# Patient Record
Sex: Female | Born: 1945 | Race: White | Hispanic: No | Marital: Married | Smoking: Never smoker
Health system: Southern US, Community
[De-identification: ages and names within clinical notes are randomized; demographics above are authoritative.]

## PROBLEM LIST (undated history)

## (undated) DIAGNOSIS — I729 Aneurysm of unspecified site: Secondary | ICD-10-CM

## (undated) DIAGNOSIS — K589 Irritable bowel syndrome without diarrhea: Secondary | ICD-10-CM

## (undated) DIAGNOSIS — I5189 Other ill-defined heart diseases: Secondary | ICD-10-CM

## (undated) DIAGNOSIS — F32A Depression, unspecified: Secondary | ICD-10-CM

## (undated) DIAGNOSIS — K279 Peptic ulcer, site unspecified, unspecified as acute or chronic, without hemorrhage or perforation: Secondary | ICD-10-CM

## (undated) DIAGNOSIS — M25561 Pain in right knee: Secondary | ICD-10-CM

## (undated) DIAGNOSIS — I10 Essential (primary) hypertension: Secondary | ICD-10-CM

## (undated) DIAGNOSIS — S83209A Unspecified tear of unspecified meniscus, current injury, unspecified knee, initial encounter: Secondary | ICD-10-CM

## (undated) DIAGNOSIS — M1990 Unspecified osteoarthritis, unspecified site: Secondary | ICD-10-CM

## (undated) DIAGNOSIS — G4730 Sleep apnea, unspecified: Secondary | ICD-10-CM

## (undated) DIAGNOSIS — J209 Acute bronchitis, unspecified: Secondary | ICD-10-CM

## (undated) DIAGNOSIS — I2510 Atherosclerotic heart disease of native coronary artery without angina pectoris: Secondary | ICD-10-CM

## (undated) DIAGNOSIS — F329 Major depressive disorder, single episode, unspecified: Secondary | ICD-10-CM

## (undated) DIAGNOSIS — N951 Menopausal and female climacteric states: Secondary | ICD-10-CM

## (undated) DIAGNOSIS — R32 Unspecified urinary incontinence: Secondary | ICD-10-CM

## (undated) DIAGNOSIS — F419 Anxiety disorder, unspecified: Secondary | ICD-10-CM

## (undated) DIAGNOSIS — M545 Low back pain, unspecified: Secondary | ICD-10-CM

## (undated) DIAGNOSIS — K219 Gastro-esophageal reflux disease without esophagitis: Secondary | ICD-10-CM

## (undated) DIAGNOSIS — E785 Hyperlipidemia, unspecified: Secondary | ICD-10-CM

## (undated) DIAGNOSIS — N3281 Overactive bladder: Secondary | ICD-10-CM

## (undated) DIAGNOSIS — R55 Syncope and collapse: Secondary | ICD-10-CM

## (undated) HISTORY — DX: Peptic ulcer, site unspecified, unspecified as acute or chronic, without hemorrhage or perforation: K27.9

## (undated) HISTORY — DX: Other ill-defined heart diseases: I51.89

## (undated) HISTORY — DX: Menopausal and female climacteric states: N95.1

## (undated) HISTORY — DX: Unspecified tear of unspecified meniscus, current injury, unspecified knee, initial encounter: S83.209A

## (undated) HISTORY — PX: ROTATOR CUFF REPAIR: SHX139

## (undated) HISTORY — DX: Unspecified osteoarthritis, unspecified site: M19.90

## (undated) HISTORY — DX: Aneurysm of unspecified site: I72.9

## (undated) HISTORY — PX: BREAST BIOPSY: SHX20

## (undated) HISTORY — DX: Atherosclerotic heart disease of native coronary artery without angina pectoris: I25.10

## (undated) HISTORY — DX: Acute bronchitis, unspecified: J20.9

## (undated) HISTORY — DX: Depression, unspecified: F32.A

## (undated) HISTORY — DX: Syncope and collapse: R55

## (undated) HISTORY — DX: Pain in right knee: M25.561

## (undated) HISTORY — PX: CATARACT EXTRACTION, BILATERAL: SHX1313

## (undated) HISTORY — DX: Anxiety disorder, unspecified: F41.9

## (undated) HISTORY — DX: Low back pain: M54.5

## (undated) HISTORY — PX: MANDIBLE SURGERY: SHX707

## (undated) HISTORY — DX: Essential (primary) hypertension: I10

## (undated) HISTORY — DX: Unspecified urinary incontinence: R32

## (undated) HISTORY — DX: Irritable bowel syndrome, unspecified: K58.9

## (undated) HISTORY — DX: Major depressive disorder, single episode, unspecified: F32.9

## (undated) HISTORY — PX: TOTAL ABDOMINAL HYSTERECTOMY: SHX209

## (undated) HISTORY — DX: Overactive bladder: N32.81

## (undated) HISTORY — DX: Gastro-esophageal reflux disease without esophagitis: K21.9

## (undated) HISTORY — DX: Low back pain, unspecified: M54.50

## (undated) HISTORY — DX: Hyperlipidemia, unspecified: E78.5

---

## 1999-03-24 HISTORY — PX: ESOPHAGOGASTRODUODENOSCOPY: SHX1529

## 1999-03-24 HISTORY — PX: ABDOMINAL AORTIC ANEURYSM REPAIR: SUR1152

## 1999-03-24 HISTORY — PX: COLONOSCOPY: SHX174

## 2003-05-22 HISTORY — PX: COLONOSCOPY: SHX174

## 2003-06-01 ENCOUNTER — Encounter (INDEPENDENT_AMBULATORY_CARE_PROVIDER_SITE_OTHER): Payer: Self-pay | Admitting: Family Medicine

## 2004-04-10 ENCOUNTER — Ambulatory Visit: Payer: Self-pay | Admitting: Unknown Physician Specialty

## 2004-04-10 ENCOUNTER — Encounter (INDEPENDENT_AMBULATORY_CARE_PROVIDER_SITE_OTHER): Payer: Self-pay | Admitting: Family Medicine

## 2004-09-20 ENCOUNTER — Encounter (INDEPENDENT_AMBULATORY_CARE_PROVIDER_SITE_OTHER): Payer: Self-pay | Admitting: Family Medicine

## 2004-09-20 LAB — CONVERTED CEMR LAB: Pap Smear: NORMAL

## 2005-07-24 ENCOUNTER — Ambulatory Visit: Payer: Self-pay | Admitting: Family Medicine

## 2005-07-27 ENCOUNTER — Ambulatory Visit: Payer: Self-pay | Admitting: Family Medicine

## 2005-07-27 ENCOUNTER — Ambulatory Visit (HOSPITAL_COMMUNITY): Admission: RE | Admit: 2005-07-27 | Discharge: 2005-07-27 | Payer: Self-pay | Admitting: Family Medicine

## 2005-07-28 ENCOUNTER — Ambulatory Visit (HOSPITAL_COMMUNITY): Admission: RE | Admit: 2005-07-28 | Discharge: 2005-07-28 | Payer: Self-pay | Admitting: Family Medicine

## 2005-07-28 ENCOUNTER — Encounter (INDEPENDENT_AMBULATORY_CARE_PROVIDER_SITE_OTHER): Payer: Self-pay | Admitting: Family Medicine

## 2005-08-07 ENCOUNTER — Ambulatory Visit: Payer: Self-pay | Admitting: Family Medicine

## 2005-08-21 ENCOUNTER — Ambulatory Visit: Payer: Self-pay | Admitting: Family Medicine

## 2005-08-26 ENCOUNTER — Encounter (INDEPENDENT_AMBULATORY_CARE_PROVIDER_SITE_OTHER): Payer: Self-pay | Admitting: Family Medicine

## 2005-08-26 ENCOUNTER — Ambulatory Visit (HOSPITAL_COMMUNITY): Admission: RE | Admit: 2005-08-26 | Discharge: 2005-08-26 | Payer: Self-pay | Admitting: General Surgery

## 2005-09-01 ENCOUNTER — Encounter (INDEPENDENT_AMBULATORY_CARE_PROVIDER_SITE_OTHER): Payer: Self-pay | Admitting: Family Medicine

## 2005-09-01 ENCOUNTER — Ambulatory Visit: Payer: Self-pay | Admitting: General Practice

## 2005-09-03 ENCOUNTER — Ambulatory Visit: Payer: Self-pay | Admitting: Family Medicine

## 2005-09-28 ENCOUNTER — Ambulatory Visit: Payer: Self-pay | Admitting: Ophthalmology

## 2006-01-29 ENCOUNTER — Ambulatory Visit: Payer: Self-pay | Admitting: Family Medicine

## 2006-01-29 LAB — CONVERTED CEMR LAB
RBC count: 4.78 10*6/uL
TSH: 1.225 microintl units/mL

## 2006-02-26 ENCOUNTER — Ambulatory Visit: Payer: Self-pay | Admitting: Family Medicine

## 2006-03-02 ENCOUNTER — Encounter: Payer: Self-pay | Admitting: Family Medicine

## 2006-03-02 DIAGNOSIS — K219 Gastro-esophageal reflux disease without esophagitis: Secondary | ICD-10-CM

## 2006-03-02 DIAGNOSIS — K279 Peptic ulcer, site unspecified, unspecified as acute or chronic, without hemorrhage or perforation: Secondary | ICD-10-CM

## 2006-03-02 DIAGNOSIS — J309 Allergic rhinitis, unspecified: Secondary | ICD-10-CM | POA: Insufficient documentation

## 2006-03-02 DIAGNOSIS — E785 Hyperlipidemia, unspecified: Secondary | ICD-10-CM

## 2006-03-02 DIAGNOSIS — M545 Low back pain: Secondary | ICD-10-CM

## 2006-03-02 DIAGNOSIS — N318 Other neuromuscular dysfunction of bladder: Secondary | ICD-10-CM

## 2006-03-02 DIAGNOSIS — F329 Major depressive disorder, single episode, unspecified: Secondary | ICD-10-CM

## 2006-03-02 DIAGNOSIS — F411 Generalized anxiety disorder: Secondary | ICD-10-CM | POA: Insufficient documentation

## 2006-03-02 DIAGNOSIS — K589 Irritable bowel syndrome without diarrhea: Secondary | ICD-10-CM

## 2006-03-02 DIAGNOSIS — M1990 Unspecified osteoarthritis, unspecified site: Secondary | ICD-10-CM | POA: Insufficient documentation

## 2006-03-29 ENCOUNTER — Ambulatory Visit: Payer: Self-pay | Admitting: Ophthalmology

## 2006-03-30 ENCOUNTER — Encounter (INDEPENDENT_AMBULATORY_CARE_PROVIDER_SITE_OTHER): Payer: Self-pay | Admitting: Family Medicine

## 2006-04-09 ENCOUNTER — Ambulatory Visit: Payer: Self-pay | Admitting: Family Medicine

## 2006-04-09 LAB — CONVERTED CEMR LAB
Alkaline Phosphatase: 80 units/L (ref 39–117)
Indirect Bilirubin: 0.4 mg/dL (ref 0.0–0.9)
Total Protein: 7.5 g/dL (ref 6.0–8.3)

## 2006-05-21 ENCOUNTER — Ambulatory Visit: Payer: Self-pay | Admitting: Family Medicine

## 2006-05-21 DIAGNOSIS — N951 Menopausal and female climacteric states: Secondary | ICD-10-CM

## 2006-05-21 LAB — CONVERTED CEMR LAB
HDL goal, serum: 40 mg/dL
LDL Goal: 160 mg/dL

## 2006-05-24 LAB — CONVERTED CEMR LAB
ALT: 17 units/L (ref 0–35)
AST: 15 units/L (ref 0–37)
Albumin: 4.2 g/dL (ref 3.5–5.2)
BUN: 14 mg/dL (ref 6–23)
Basophils Absolute: 0 10*3/uL (ref 0.0–0.1)
Basophils Relative: 1 % (ref 0–1)
CO2: 24 meq/L (ref 19–32)
Cholesterol: 192 mg/dL (ref 0–200)
Creatinine, Ser: 0.66 mg/dL (ref 0.40–1.20)
Glucose, Bld: 88 mg/dL (ref 70–99)
MCHC: 32.2 g/dL (ref 30.0–36.0)
Monocytes Absolute: 0.3 10*3/uL (ref 0.2–0.7)
Potassium: 4.6 meq/L (ref 3.5–5.3)
RBC: 4.68 M/uL (ref 3.87–5.11)
RDW: 13.5 % (ref 11.5–14.0)
Total CHOL/HDL Ratio: 3.8
Triglycerides: 123 mg/dL (ref <150)
VLDL: 25 mg/dL (ref 0–40)
WBC: 4.6 10*3/uL (ref 4.0–10.5)

## 2006-09-17 ENCOUNTER — Ambulatory Visit: Payer: Self-pay | Admitting: Family Medicine

## 2006-09-17 LAB — CONVERTED CEMR LAB: Hemoglobin: 12.5 g/dL

## 2006-09-20 ENCOUNTER — Ambulatory Visit: Payer: Self-pay | Admitting: Endocrinology

## 2006-09-28 ENCOUNTER — Ambulatory Visit: Payer: Self-pay | Admitting: Endocrinology

## 2006-09-28 ENCOUNTER — Encounter (INDEPENDENT_AMBULATORY_CARE_PROVIDER_SITE_OTHER): Payer: Self-pay | Admitting: Family Medicine

## 2006-09-30 ENCOUNTER — Telehealth (INDEPENDENT_AMBULATORY_CARE_PROVIDER_SITE_OTHER): Payer: Self-pay | Admitting: *Deleted

## 2006-10-01 ENCOUNTER — Ambulatory Visit: Payer: Self-pay | Admitting: Family Medicine

## 2006-10-13 ENCOUNTER — Encounter (INDEPENDENT_AMBULATORY_CARE_PROVIDER_SITE_OTHER): Payer: Self-pay | Admitting: Family Medicine

## 2006-10-15 ENCOUNTER — Encounter: Admission: RE | Admit: 2006-10-15 | Discharge: 2006-10-15 | Payer: Self-pay | Admitting: *Deleted

## 2006-10-15 ENCOUNTER — Encounter (INDEPENDENT_AMBULATORY_CARE_PROVIDER_SITE_OTHER): Payer: Self-pay | Admitting: Diagnostic Radiology

## 2006-10-19 ENCOUNTER — Telehealth (INDEPENDENT_AMBULATORY_CARE_PROVIDER_SITE_OTHER): Payer: Self-pay | Admitting: Family Medicine

## 2006-12-17 ENCOUNTER — Ambulatory Visit: Payer: Self-pay | Admitting: Family Medicine

## 2006-12-17 DIAGNOSIS — M25569 Pain in unspecified knee: Secondary | ICD-10-CM

## 2006-12-17 LAB — CONVERTED CEMR LAB: LDL Goal: 100 mg/dL

## 2006-12-21 ENCOUNTER — Ambulatory Visit (HOSPITAL_COMMUNITY): Admission: RE | Admit: 2006-12-21 | Discharge: 2006-12-21 | Payer: Self-pay | Admitting: Family Medicine

## 2006-12-24 DIAGNOSIS — IMO0002 Reserved for concepts with insufficient information to code with codable children: Secondary | ICD-10-CM

## 2006-12-30 ENCOUNTER — Ambulatory Visit: Payer: Self-pay | Admitting: Family Medicine

## 2006-12-31 ENCOUNTER — Encounter (INDEPENDENT_AMBULATORY_CARE_PROVIDER_SITE_OTHER): Payer: Self-pay | Admitting: Family Medicine

## 2007-01-03 LAB — CONVERTED CEMR LAB
ALT: 17 units/L (ref 0–35)
Albumin: 4.1 g/dL (ref 3.5–5.2)
Alkaline Phosphatase: 61 units/L (ref 39–117)
BUN: 15 mg/dL (ref 6–23)
Basophils Absolute: 0 10*3/uL (ref 0.0–0.1)
Basophils Relative: 0 % (ref 0–1)
Chloride: 108 meq/L (ref 96–112)
Creatinine, Ser: 0.59 mg/dL (ref 0.40–1.20)
MCHC: 32.6 g/dL (ref 30.0–36.0)
Monocytes Absolute: 0.3 10*3/uL (ref 0.2–0.7)
Monocytes Relative: 5 % (ref 3–11)
Neutrophils Relative %: 60 % (ref 43–77)
Potassium: 4.4 meq/L (ref 3.5–5.3)
TSH: 1.133 microintl units/mL (ref 0.350–5.50)
Total Bilirubin: 0.3 mg/dL (ref 0.3–1.2)
WBC: 5.8 10*3/uL (ref 4.0–10.5)

## 2007-03-01 ENCOUNTER — Telehealth (INDEPENDENT_AMBULATORY_CARE_PROVIDER_SITE_OTHER): Payer: Self-pay | Admitting: *Deleted

## 2007-03-04 ENCOUNTER — Encounter (INDEPENDENT_AMBULATORY_CARE_PROVIDER_SITE_OTHER): Payer: Self-pay | Admitting: Family Medicine

## 2007-03-10 ENCOUNTER — Ambulatory Visit: Payer: Self-pay | Admitting: Family Medicine

## 2007-03-24 HISTORY — PX: KNEE ARTHROSCOPY: SUR90

## 2007-04-07 ENCOUNTER — Ambulatory Visit (HOSPITAL_COMMUNITY): Admission: RE | Admit: 2007-04-07 | Discharge: 2007-04-07 | Payer: Self-pay | Admitting: Orthopaedic Surgery

## 2007-04-22 ENCOUNTER — Ambulatory Visit: Payer: Self-pay | Admitting: Family Medicine

## 2007-04-22 DIAGNOSIS — R32 Unspecified urinary incontinence: Secondary | ICD-10-CM | POA: Insufficient documentation

## 2007-12-22 ENCOUNTER — Ambulatory Visit: Payer: Self-pay | Admitting: Endocrinology

## 2008-01-20 ENCOUNTER — Ambulatory Visit: Payer: Self-pay | Admitting: Family Medicine

## 2008-01-21 ENCOUNTER — Encounter (INDEPENDENT_AMBULATORY_CARE_PROVIDER_SITE_OTHER): Payer: Self-pay | Admitting: Family Medicine

## 2008-01-23 LAB — CONVERTED CEMR LAB
Albumin: 4.2 g/dL (ref 3.5–5.2)
BUN: 17 mg/dL (ref 6–23)
Basophils Absolute: 0 10*3/uL (ref 0.0–0.1)
Basophils Relative: 0 % (ref 0–1)
CO2: 19 meq/L (ref 19–32)
Calcium: 9 mg/dL (ref 8.4–10.5)
Creatinine, Ser: 0.69 mg/dL (ref 0.40–1.20)
Eosinophils Absolute: 0.1 10*3/uL (ref 0.0–0.7)
Glucose, Bld: 98 mg/dL (ref 70–99)
Hemoglobin: 13.6 g/dL (ref 12.0–15.0)
LDL Cholesterol: 160 mg/dL — ABNORMAL HIGH (ref 0–99)
Monocytes Absolute: 0.3 10*3/uL (ref 0.1–1.0)
Monocytes Relative: 7 % (ref 3–12)
Platelets: 253 10*3/uL (ref 150–400)
RBC: 4.65 M/uL (ref 3.87–5.11)
TSH: 1.215 microintl units/mL (ref 0.350–4.50)
Triglycerides: 102 mg/dL (ref <150)
VLDL: 20 mg/dL (ref 0–40)

## 2008-01-25 ENCOUNTER — Telehealth (INDEPENDENT_AMBULATORY_CARE_PROVIDER_SITE_OTHER): Payer: Self-pay | Admitting: *Deleted

## 2008-01-25 ENCOUNTER — Encounter (INDEPENDENT_AMBULATORY_CARE_PROVIDER_SITE_OTHER): Payer: Self-pay | Admitting: Family Medicine

## 2008-07-25 ENCOUNTER — Ambulatory Visit: Payer: Self-pay | Admitting: Family Medicine

## 2008-07-25 ENCOUNTER — Ambulatory Visit (HOSPITAL_COMMUNITY): Admission: RE | Admit: 2008-07-25 | Discharge: 2008-07-25 | Payer: Self-pay | Admitting: Family Medicine

## 2008-07-25 DIAGNOSIS — R51 Headache: Secondary | ICD-10-CM

## 2008-07-25 DIAGNOSIS — R519 Headache, unspecified: Secondary | ICD-10-CM | POA: Insufficient documentation

## 2008-07-25 DIAGNOSIS — I2510 Atherosclerotic heart disease of native coronary artery without angina pectoris: Secondary | ICD-10-CM | POA: Insufficient documentation

## 2008-07-25 LAB — CONVERTED CEMR LAB
ALT: 20 units/L (ref 0–35)
AST: 17 units/L (ref 0–37)
BUN: 12 mg/dL (ref 6–23)
Basophils Absolute: 0 10*3/uL (ref 0.0–0.1)
CO2: 23 meq/L (ref 19–32)
Glucose, Bld: 125 mg/dL — ABNORMAL HIGH (ref 70–99)
HCT: 39.8 % (ref 36.0–46.0)
MCHC: 34.4 g/dL (ref 30.0–36.0)
MCV: 90.8 fL (ref 78.0–100.0)
Monocytes Relative: 3 % (ref 3–12)
Neutro Abs: 4.3 10*3/uL (ref 1.7–7.7)
Neutrophils Relative %: 77 % (ref 43–77)
Platelets: 222 10*3/uL (ref 150–400)
RBC: 4.39 M/uL (ref 3.87–5.11)
RDW: 13.3 % (ref 11.5–15.5)
Total Bilirubin: 0.6 mg/dL (ref 0.3–1.2)
Total Protein: 6.8 g/dL (ref 6.0–8.3)
WBC: 5.5 10*3/uL (ref 4.0–10.5)

## 2008-08-09 ENCOUNTER — Ambulatory Visit: Payer: Self-pay | Admitting: Family Medicine

## 2008-08-11 ENCOUNTER — Encounter (INDEPENDENT_AMBULATORY_CARE_PROVIDER_SITE_OTHER): Payer: Self-pay | Admitting: Family Medicine

## 2008-08-13 ENCOUNTER — Encounter (INDEPENDENT_AMBULATORY_CARE_PROVIDER_SITE_OTHER): Payer: Self-pay | Admitting: *Deleted

## 2008-08-13 LAB — CONVERTED CEMR LAB
LDL Cholesterol: 156 mg/dL — ABNORMAL HIGH (ref 0–99)
Triglycerides: 128 mg/dL (ref <150)
VLDL: 26 mg/dL (ref 0–40)

## 2008-08-16 ENCOUNTER — Encounter: Payer: Self-pay | Admitting: Cardiology

## 2008-08-16 ENCOUNTER — Ambulatory Visit: Payer: Self-pay | Admitting: Cardiology

## 2008-08-16 ENCOUNTER — Encounter (INDEPENDENT_AMBULATORY_CARE_PROVIDER_SITE_OTHER): Payer: Self-pay | Admitting: Family Medicine

## 2008-08-23 ENCOUNTER — Encounter: Payer: Self-pay | Admitting: Cardiology

## 2008-08-23 ENCOUNTER — Ambulatory Visit (HOSPITAL_COMMUNITY): Admission: RE | Admit: 2008-08-23 | Discharge: 2008-08-23 | Payer: Self-pay | Admitting: Cardiology

## 2008-08-23 ENCOUNTER — Ambulatory Visit: Payer: Self-pay | Admitting: Cardiology

## 2008-09-05 ENCOUNTER — Encounter (INDEPENDENT_AMBULATORY_CARE_PROVIDER_SITE_OTHER): Payer: Self-pay | Admitting: *Deleted

## 2008-09-05 ENCOUNTER — Ambulatory Visit: Payer: Self-pay | Admitting: Cardiology

## 2008-09-05 ENCOUNTER — Ambulatory Visit (HOSPITAL_COMMUNITY): Admission: RE | Admit: 2008-09-05 | Discharge: 2008-09-05 | Payer: Self-pay | Admitting: Cardiology

## 2008-09-05 LAB — CONVERTED CEMR LAB
CO2: 26 meq/L
Calcium: 9.5 mg/dL
Prothrombin Time: 13.4 s
Sodium: 139 meq/L

## 2008-09-10 ENCOUNTER — Inpatient Hospital Stay (HOSPITAL_BASED_OUTPATIENT_CLINIC_OR_DEPARTMENT_OTHER): Admission: RE | Admit: 2008-09-10 | Discharge: 2008-09-10 | Payer: Self-pay | Admitting: Internal Medicine

## 2008-09-10 ENCOUNTER — Ambulatory Visit: Payer: Self-pay | Admitting: Cardiovascular Disease

## 2008-10-01 ENCOUNTER — Ambulatory Visit: Payer: Self-pay | Admitting: Cardiology

## 2008-10-04 ENCOUNTER — Ambulatory Visit (HOSPITAL_COMMUNITY): Admission: RE | Admit: 2008-10-04 | Discharge: 2008-10-04 | Payer: Self-pay | Admitting: Cardiology

## 2008-10-05 ENCOUNTER — Ambulatory Visit: Payer: Self-pay | Admitting: Cardiology

## 2008-11-02 ENCOUNTER — Encounter: Payer: Self-pay | Admitting: Cardiology

## 2008-11-03 ENCOUNTER — Encounter: Payer: Self-pay | Admitting: Cardiology

## 2008-11-03 ENCOUNTER — Encounter (INDEPENDENT_AMBULATORY_CARE_PROVIDER_SITE_OTHER): Payer: Self-pay | Admitting: *Deleted

## 2008-11-03 LAB — CONVERTED CEMR LAB
AST: 17 units/L
AST: 17 units/L (ref 0–37)
Albumin: 4.1 g/dL
Alkaline Phosphatase: 65 units/L
BUN: 15 mg/dL
BUN: 15 mg/dL (ref 6–23)
CO2: 23 meq/L
Cholesterol: 139 mg/dL (ref 0–200)
Creatinine, Ser: 0.59 mg/dL
Creatinine, Ser: 0.59 mg/dL (ref 0.40–1.20)
Glucose, Bld: 90 mg/dL
HDL: 52 mg/dL (ref >39)
LDL Cholesterol: 72 mg/dL (ref 0–99)
Sodium: 139 meq/L (ref 135–145)
Total Bilirubin: 0.4 mg/dL (ref 0.3–1.2)
Total Protein: 6.9 g/dL
Total Protein: 6.9 g/dL (ref 6.0–8.3)
Triglycerides: 74 mg/dL (ref <150)

## 2008-11-05 ENCOUNTER — Encounter: Payer: Self-pay | Admitting: Cardiology

## 2008-11-05 ENCOUNTER — Telehealth: Payer: Self-pay | Admitting: Cardiology

## 2008-11-23 ENCOUNTER — Ambulatory Visit: Payer: Self-pay | Admitting: Cardiology

## 2008-11-30 ENCOUNTER — Ambulatory Visit: Payer: Self-pay | Admitting: Family Medicine

## 2008-11-30 LAB — CONVERTED CEMR LAB
Cholesterol, target level: 200 mg/dL
HDL goal, serum: 40 mg/dL
LDL Goal: 130 mg/dL

## 2008-12-10 ENCOUNTER — Telehealth (INDEPENDENT_AMBULATORY_CARE_PROVIDER_SITE_OTHER): Payer: Self-pay | Admitting: *Deleted

## 2009-01-10 ENCOUNTER — Ambulatory Visit: Payer: Self-pay

## 2009-01-16 ENCOUNTER — Ambulatory Visit: Payer: Self-pay

## 2009-02-27 ENCOUNTER — Encounter (INDEPENDENT_AMBULATORY_CARE_PROVIDER_SITE_OTHER): Payer: Self-pay | Admitting: *Deleted

## 2009-02-28 ENCOUNTER — Ambulatory Visit: Payer: Self-pay | Admitting: Cardiology

## 2009-02-28 ENCOUNTER — Encounter (INDEPENDENT_AMBULATORY_CARE_PROVIDER_SITE_OTHER): Payer: Self-pay | Admitting: *Deleted

## 2009-03-25 ENCOUNTER — Telehealth: Payer: Self-pay | Admitting: Cardiology

## 2009-04-11 ENCOUNTER — Encounter (INDEPENDENT_AMBULATORY_CARE_PROVIDER_SITE_OTHER): Payer: Self-pay | Admitting: *Deleted

## 2009-04-11 LAB — CONVERTED CEMR LAB
BUN: 14 mg/dL (ref 6–23)
Chloride: 104 meq/L (ref 96–112)
Creatinine, Ser: 0.69 mg/dL (ref 0.40–1.20)
Glucose, Bld: 99 mg/dL
Glucose, Bld: 99 mg/dL (ref 70–99)
Potassium: 3.7 meq/L
Potassium: 3.7 meq/L (ref 3.5–5.3)
Sodium: 141 meq/L (ref 135–145)

## 2009-04-16 ENCOUNTER — Telehealth (INDEPENDENT_AMBULATORY_CARE_PROVIDER_SITE_OTHER): Payer: Self-pay | Admitting: *Deleted

## 2009-05-30 ENCOUNTER — Encounter (INDEPENDENT_AMBULATORY_CARE_PROVIDER_SITE_OTHER): Payer: Self-pay | Admitting: *Deleted

## 2009-05-30 LAB — CONVERTED CEMR LAB
Albumin: 4.3 g/dL
Alkaline Phosphatase: 76 units/L
Cholesterol: 151 mg/dL
Free T4: 10.6 ng/dL
Glomerular Filtration Rate, Af Am: >59 mL/min/{1.73_m2}
Glucose, Bld: 89 mg/dL
HCT: 42.2 %
HDL: 57 mg/dL
LDL Cholesterol: 71 mg/dL
MCV: 292 fL
Potassium: 4 meq/L
Triglycerides: 117 mg/dL
WBC: 6.8 10*3/uL

## 2009-06-03 ENCOUNTER — Encounter (INDEPENDENT_AMBULATORY_CARE_PROVIDER_SITE_OTHER): Payer: Self-pay | Admitting: *Deleted

## 2009-06-05 ENCOUNTER — Ambulatory Visit: Payer: Self-pay | Admitting: Cardiology

## 2009-06-06 ENCOUNTER — Encounter: Payer: Self-pay | Admitting: Adult Health

## 2009-06-20 ENCOUNTER — Telehealth: Payer: Self-pay | Admitting: Cardiology

## 2009-06-24 ENCOUNTER — Encounter (INDEPENDENT_AMBULATORY_CARE_PROVIDER_SITE_OTHER): Payer: Self-pay | Admitting: *Deleted

## 2009-07-01 ENCOUNTER — Encounter: Payer: Self-pay | Admitting: Physician Assistant

## 2009-07-03 ENCOUNTER — Ambulatory Visit: Payer: Self-pay

## 2009-07-23 ENCOUNTER — Ambulatory Visit (HOSPITAL_COMMUNITY): Admission: RE | Admit: 2009-07-23 | Discharge: 2009-07-23 | Payer: Self-pay | Admitting: Cardiology

## 2009-07-24 ENCOUNTER — Ambulatory Visit: Payer: Self-pay | Admitting: Cardiology

## 2009-08-08 ENCOUNTER — Ambulatory Visit (HOSPITAL_COMMUNITY): Payer: Self-pay | Admitting: Psychiatry

## 2009-08-20 ENCOUNTER — Ambulatory Visit (HOSPITAL_COMMUNITY): Payer: Self-pay | Admitting: Psychiatry

## 2009-10-25 ENCOUNTER — Ambulatory Visit: Payer: Self-pay | Admitting: Cardiology

## 2009-10-25 ENCOUNTER — Encounter (INDEPENDENT_AMBULATORY_CARE_PROVIDER_SITE_OTHER): Payer: Self-pay

## 2009-10-25 LAB — CONVERTED CEMR LAB
Albumin: 4.3 g/dL
BUN: 16 mg/dL
Creatinine, Ser: 0.73 mg/dL
Glucose, Bld: 86 mg/dL
Potassium: 3.9 meq/L
Total Protein: 7.8 g/dL

## 2009-11-12 ENCOUNTER — Encounter (INDEPENDENT_AMBULATORY_CARE_PROVIDER_SITE_OTHER): Payer: Self-pay | Admitting: *Deleted

## 2010-02-12 ENCOUNTER — Telehealth: Payer: Self-pay | Admitting: Cardiology

## 2010-02-14 ENCOUNTER — Encounter (INDEPENDENT_AMBULATORY_CARE_PROVIDER_SITE_OTHER): Payer: Self-pay | Admitting: *Deleted

## 2010-04-22 NOTE — Miscellaneous (Signed)
Summary: CMP  Clinical Lists Changes  Observations: Added new observation of CALCIUM: 9.7 mg/dL (16/12/9602 54:09) Added new observation of ALBUMIN: 4.3 g/dL (81/19/1478 29:56) Added new observation of PROTEIN, TOT: 7.8 g/dL (21/30/8657 84:69) Added new observation of SGPT (ALT): 20 units/L (10/25/2009 13:57) Added new observation of SGOT (AST): 22 units/L (10/25/2009 13:57) Added new observation of ALK PHOS: 9.7 units/L (10/25/2009 13:57) Added new observation of BILI DIRECT: Bili Total: 0.4 mg/dL (62/95/2841 32:44) Added new observation of CREATININE: 0.73 mg/dL (03/25/7251 66:44) Added new observation of BUN: 16 mg/dL (03/47/4259 56:38) Added new observation of BG RANDOM: 86 mg/dL (75/64/3329 51:88) Added new observation of CO2 PLSM/SER: 24 meq/L (10/25/2009 13:57) Added new observation of CL SERUM: 101 meq/L (10/25/2009 13:57) Added new observation of K SERUM: 3.9 meq/L (10/25/2009 13:57) Added new observation of NA: 138 meq/L (10/25/2009 13:57)

## 2010-04-22 NOTE — Progress Notes (Signed)
Summary: question regarding disability  Phone Note Call from Patient   Caller: Patient Reason for Call: Talk to Nurse Summary of Call: pt wants to talk to nurse regarding disability/tg Initial call taken by: Raechel Ache [facility],  June 20, 2009 1:26 PM  Follow-up for Phone Call        I called [name] [name] [phone] ext [phone]   stated she needed everything from Jan'2011 til present Follow-up by: Teressa Lower RN,  June 20, 2009 4:54 PM  Additional Follow-up for Phone Call Additional follow up Details #1::        Copied all information and sent to [facility] from [facility] Additional Follow-up by: Teressa Lower RN,  June 20, 2009 5:01 PM

## 2010-04-22 NOTE — Letter (Signed)
Summary: Clearance Letter  [facility]  [address], [place] [address]   Phone: [phone]  Fax: [phone]    November 12, 2009  Re:     [facility] Address:   [address]     [place], [place]  [address] DOB:     02-25-1946 MRN:     [id number]   Dear Dr. Manson Passey:  Mrs. Nasrin Lanzo is cleared to proceed with dental extractions.    Please have her continue all her medications as directed and call our   office if she has any problems.             Sincerely,  Joni Reining, NP

## 2010-04-22 NOTE — Miscellaneous (Signed)
Summary: labs bmp,04/11/2009  Clinical Lists Changes  Observations: Added new observation of CALCIUM: 9.6 mg/dL (15/17/6160 7:37) Added new observation of CREATININE: 0.69 mg/dL (10/62/6948 5:46) Added new observation of BUN: 14 mg/dL (27/05/5007 3:81) Added new observation of BG RANDOM: 99 mg/dL (82/99/3716 9:67) Added new observation of CO2 PLSM/SER: 24 meq/L (04/11/2009 8:55) Added new observation of CL SERUM: 104 meq/L (04/11/2009 8:55) Added new observation of K SERUM: 3.7 meq/L (04/11/2009 8:55) Added new observation of NA: 141 meq/L (04/11/2009 8:55)

## 2010-04-22 NOTE — Assessment & Plan Note (Signed)
Summary: 3 mth fu per checkout on 07/24/09/tg   Visit Type:  Follow-up Primary Provider:  Sabino Snipes   History of Present Illness: [name] [name] returns to the office as scheduled for continued assessment and treatment of single vessel coronary disease.  Since her last visit, she has done superbly.  She was evaluated at behavioral health, where she was provided with counseling to assist her in better managing her relationship with her husband.  She is walking on a daily basis, covering 2 miles in less than one hour.  She denies chest discomfort, dyspnea, orthopnea, PND, lightheadedness and syncope.  Current Medications (verified): 1)  Aspirin 81 Mg Tbec (Aspirin) .... Once Daily 2)  Klonopin 0.5 Mg Tabs (Clonazepam) .... One Two Times A Day 3)  Simvastatin 40 Mg Tabs (Simvastatin) .... Take One Tablet By Mouth Daily At Bedtime 4)  Calcium 500 Mg Tabs (Calcium Carbonate) .... Take 2  Tablet By Mouth Once A Day 5)  B Complex  Tabs (B Complex Vitamins) .... Take 1 Tablet By Mouth Once A Day 6)  Daily Vites  Tabs (Multiple Vitamin) .... Take 1 Tablet By Mouth Once A Day 7)  Chlorthalidone 25 Mg Tabs (Chlorthalidone) .... Take One  Half Tablet By Mouth Daily 8)  Nitrostat 0.4 Mg Subl (Nitroglycerin) .Marland Kitchen.. 1 Tablet Under Tongue At Onset of Chest Pain; You May Repeat Every 5 Minutes For Up To 3 Doses. 9)  Zolpidem Tartrate 10 Mg Tabs (Zolpidem Tartrate) .... Take 1 Tab Qhs 10)  Verapamil Hcl 120 Mg Tabs (Verapamil Hcl) .... Take 1 Tablet By Mouth Once Daily  Allergies (verified): 1)  ! Pcn 2)  ! Codeine  Past History:  PMH, FH, and Social History reviewed and updated.  Review of Systems       See history of present illness.  Vital Signs:  Patient profile:   65 year old female Weight:      142 pounds BMI:     27.83 Pulse rate:   66 / minute BP sitting:   121 / 67  (right arm)  Vitals Entered By: Dreama Saa, CNA (October 25, 2009 10:59 AM)  Physical Exam  General:   Mildly overweight; well developed; no acute distress:   Neck-No JVD; no carotid bruits Lungs-No tachypnea, no rales; no rhonchi; no wheezes: Cardiovascular-normal PMI; normal S1 and S2; S4 present Abdomen-BS normal; soft and non-tender without masses or organomegaly:  Musculoskeletal-No deformities, no cyanosis or clubbing: Neurologic-Normal cranial nerves; symmetric strength and tone:  Skin-Warm, no significant lesions: Extremities-Nl distal pulses; no edema:     Impression & Recommendations:  Problem # 1:  ATHEROSCLEROTIC CARDIOVASCULAR DISEASE-CHEST PAIN (ICD-429.2) Patient has single-vessel disease with ischemia demonstrated on stress testing, , which is caused by a vessel of small size not amenable to percutaneous intervention.  She is currently asymptomatic despite a moderate level of exercise.  She will call to report any change in symptoms.  Problem # 2:  HYPERTENSION, BENIGN (ICD-401.1) Blood pressure control is excellent; current medications will be continued except that verapamil HCl will be substituted for amlodipine due to a potential interaction between amlodipine and simvastatin.  Problem # 3:  HYPERLIPIDEMIA (ICD-272.4)  Recent lipid profile was optimal with a total cholesterol 151, triglycerides of 117, HDL 57 and LDL of 71.  Current therapy will be continued.  Problem # 4:  ANXIETY (ICD-300.00) Emotional state is much improved, perhaps due to a combination of exercise and better communication with her husband.  She did not  require continuing therapy with Behavioral Health and will return to see them on an as-needed basis.  I will plan to see this nice woman again in the office in one year.  Other Orders: T-Comprehensive Metabolic Panel [id number]) Future Orders: T-Comprehensive Metabolic Panel ([id number]) ... 04/21/2010  Patient Instructions: 1)  Your physician recommends that you schedule a follow-up appointment in: 1 year 2)  Your physician recommends that  you return for lab work in: now and in 6 months 3)  Your physician has recommended you make the following change in your medication: stop taking Amlodipine (Norvasc) and start taking Verpamil 120mg  once daily  Prescriptions: VERAPAMIL HCL 120 MG TABS (VERAPAMIL HCL) take 1 tablet by mouth once daily  #30 x 11   Entered by:   Larita Fife Via LPN   Authorized by:   Kathlen Brunswick, MD, [facility]   Signed by:   Larita Fife Via LPN on 78/46/9629   Method used:   Electronically to        [facility] (retail)       [address] [place], [place]  [address]       Ph: [phone]       Fax: [phone]   RxID:   [id number]

## 2010-04-22 NOTE — Assessment & Plan Note (Signed)
Summary: f63m   Visit Type:  Follow-up Primary Provider:  Sabino Snipes   History of Present Illness: Ms. Renne Cornick returns to the office  continued assessment and treatment of single vessel coronary disease, chest discomfort, exercise intolerance and dysesthesia over the right side of her body.  She is unable to complete a shopping trip to the supermarket without resting.  The problem seems to be extreme fatigue; there is no real dyspnea.  She continues to have intermittent chest discomfort, but this is substantially improved.  She has retired from work at [employer], and is generally happy about that.  There continues to be concerned regarding a psychologic overlay to her symptoms.  She has trouble falling asleep and is using a hypnotic every night.  She was seen in the presence of her husband, and may not have been comfortable in discussing her psychologic status.  She is concerned about being considered "crazy".  Current Medications (verified): 1)  Aspirin 81 Mg Tbec (Aspirin) .... Once Daily 2)  Klonopin 0.5 Mg Tabs (Clonazepam) .... One Two Times A Day 3)  Simvastatin 40 Mg Tabs (Simvastatin) .... Take One Tablet By Mouth Daily At Bedtime 4)  Amlodipine Besylate 2.5 Mg Tabs (Amlodipine Besylate) .... Take 1 Tablet Once Daily 5)  Calcium 500 Mg Tabs (Calcium Carbonate) .... Take 2  Tablet By Mouth Once A Day 6)  B Complex  Tabs (B Complex Vitamins) .... Take 1 Tablet By Mouth Once A Day 7)  Daily Vites  Tabs (Multiple Vitamin) .... Take 1 Tablet By Mouth Once A Day 8)  Chlorthalidone 25 Mg Tabs (Chlorthalidone) .... Take One  Half Tablet By Mouth Daily 9)  Nitrostat 0.4 Mg Subl (Nitroglycerin) .Marland Kitchen.. 1 Tablet Under Tongue At Onset of Chest Pain; You May Repeat Every 5 Minutes For Up To 3 Doses. 10)  Zolpidem Tartrate 10 Mg Tabs (Zolpidem Tartrate) .... Take 1 Tab Qhs  Allergies (verified): 1)  ! Pcn 2)  ! Codeine  Past History:  PMH, FH, and Social History reviewed and  updated.  Past Medical History: ASCVD-history of chest pain; stress echo-ischemia in the circumflex distribution; coronary angiography        in 06/2008-90% stenosis of the small ramus; 50% left anterior descending; normal ejection fraction Hypertension Hyperlipidemia Near syncope INCONTINENCE (ICD-788.30) BRONCHITIS, ACUTE (ICD-466.0) MENISCUS TEAR (ICD-836.2) KNEE PAIN, RIGHT (ICD-719.46) CLIMACTERIC STATE, FEMALE (ICD-627.2) IBS (ICD-564.1) OVERACTIVE BLADDER (ICD-596.51) PUD (ICD-533.90) OSTEOARTHRITIS (ICD-715.90) LOW BACK PAIN (ICD-724.2)-degenerative joint disease HYPERLIPIDEMIA (ICD-272.4) GERD (ICD-530.81) DEPRESSION (ICD-311) ANXIETY (ICD-300.00) ALLERGIC RHINITIS (ICD-477.9)  Review of Systems       See history of present illness.  Vital Signs:  Patient profile:   65 year old female Weight:      150 pounds Pulse rate:   71 / minute BP sitting:   129 / 78  (right arm)  Vitals Entered By: Dreama Saa, CNA (Jul 24, 2009 2:11 PM)  Physical Exam  General:    Well-developed,well-nourished,in no acute distress; alert,appropriate and cooperative throughout examination; moderately overweight Neck:  no jugular venous distention; normal carotid upstrokes without bruits Lungs:  Normal respiratory effort, chest expands symmetrically. Lungs are clear to auscultation Heart:  Normal rate and regular rhythm. S1 and S2;modest basilar systolic ejection murmur. Abdomen:  Bowel sounds positive,abdomen soft and non-tender without masses, organomegaly or hernias noted. Pulses:  pulses normal in all 4 extremities Neurologic:  Cranial nerves are normal; symmetric strength and tone Psych:  Normal affect     Impression &  Recommendations:  Problem # 1:  CLAUDICATION (ICD-443.9) Patient does not have true claudication, but rather leg and generalized fatigue with exertion.  She performed a 6 minute walk test in the office and was able to cover 1000 feet without much in the way of  symptoms.  ABIs are entirely normal.  Distal pulses are intact.  I do not think there is any concern about her lower extremity perfusion.  Problem # 2:  HYPERTENSION, BENIGN (ICD-401.1) Blood pressure control remains good, and current medications will be continued.  Problem # 3:  ANXIETY (ICD-300.00) Anxiety certainly could be playing a role in her symptoms.  I have referred her to [facility] for initial evaluation.  Problem # 4:  MALAISE AND FATIGUE-WITH EXERCISE (ICD-780.79) Although Ms. Mccallum had apparent ischemia on her stress echocardiogram with a wall motion abnormality in the distribution of her diseased ramus, I doubt that her current symptoms are related to coronary disease.  Similarly, there is no evidence for lung disease.  Oxygen saturation remained above 98% during her 6 minute walk.  Her reported exercise tolerance is not as good as her actual capacity.  I recommended a exercise program with a gradual increase in activity and will reevaluate this nice woman in 3 months.  Other Orders: Misc. Referral (Misc. Ref)  Patient Instructions: 1)  Your physician recommends that you schedule a follow-up appointment in: 3 months 2)  You have been referred to Behavior health for anxiety evaluation 3)  Your physician discussed the importance of regular exercise and recommended that you start or continue a regular exercise program for good health.

## 2010-04-22 NOTE — Letter (Signed)
Summary: [place] [employer] [facility] [employer], [employer] at [employer]  [address], [place] [address]   Phone: [phone]  Fax: [phone]     February 14, 2010 MRN: [id number]   [name] [name] [address] [place], [place]  [address]      YOUR LAB WORK IS DUE   March 17, 2010  Please go to [facility], located across the street from [facility] on the second floor.  Hours are Monday - Friday 7am until 7:30pm         Saturday 8am until 12noon    X  DO NOT EAT OR DRINK AFTER MIDNIGHT EVENING PRIOR TO LABWORK

## 2010-04-22 NOTE — Progress Notes (Signed)
  Recieved Request for Records form DDS forwarded to [facility] for processing [facility]  April 16, 2009 8:46 AM

## 2010-04-22 NOTE — Assessment & Plan Note (Signed)
Summary: 3 MTH F/U PER CHECKOUT ON 02/28/09/TG   Visit Type:  Follow-up Primary Provider:  Sabino Snipes  CC:  chest tightness.  History of Present Illness: [name] [name] is a 60 CF who is here today for follow-up secondary to  Severe single-vessel coronary artery disease involving a small  ramus intermedius branch. Patent left anterior descending with nonobstructive proximal. Patent left circumflex and right coronary arteries.  Normal left ventricular function per cath in July of 2010.  She continues to have chest discomfort related to stress.  She complains of LE pain and weakness.  She also is in what she describes is a "bad marriage."  She is tearful and anxious when she discusses her marriage and the emotional stress she is under.  She often feels pain in her chest when she is upset. She says that she is low on energy and cannot sleep at night.     Current Medications (verified): 1)  Aspirin 81 Mg Tbec (Aspirin) .... Once Daily 2)  Klonopin 0.5 Mg Tabs (Clonazepam) .... One Two Times A Day 3)  Simvastatin 40 Mg Tabs (Simvastatin) .... Take One Tablet By Mouth Daily At Bedtime 4)  Amlodipine Besylate 2.5 Mg Tabs (Amlodipine Besylate) .... Take 1 Tablet Once Daily 5)  Calcium 500 Mg Tabs (Calcium Carbonate) .... Take 2  Tablet By Mouth Once A Day 6)  B Complex  Tabs (B Complex Vitamins) .... Take 1 Tablet By Mouth Once A Day 7)  Daily Vites  Tabs (Multiple Vitamin) .... Take 1 Tablet By Mouth Once A Day 8)  Chlorthalidone 25 Mg Tabs (Chlorthalidone) .... Take One  Half Tablet By Mouth Daily 9)  Nitrostat 0.4 Mg Subl (Nitroglycerin) .Marland Kitchen.. 1 Tablet Under Tongue At Onset of Chest Pain; You May Repeat Every 5 Minutes For Up To 3 Doses. 10)  Flax Seed Oil 1000 Mg Caps (Flaxseed (Linseed)) .... Take 1 Tab Daily  Allergies (verified): 1)  ! Pcn 2)  ! Codeine  Review of Systems       She cries easily and is often fatigued. She complains of insomnia. All other systems have been reviewed and are  negative unless stated above.   Vital Signs:  Patient profile:   65 year old female Weight:      156 pounds Pulse rate:   92 / minute BP sitting:   126 / 76  (right arm)  Vitals Entered By: Dreama Saa, CNA (June 05, 2009 2:22 PM)  Physical Exam  General:  normal appearance.   Lungs:  Clear bilaterally to auscultation and percussion. Heart:  Tachycardic without MRG Abdomen:  Bowel sounds positive; abdomen soft and non-tender without masses, organomegaly, or hernias noted. No hepatosplenomegaly. Msk:  Back normal, normal gait. Muscle strength and tone normal. Extremities:  No bruits, no diminished pulses. No pain with palapation of calves bilaterally. Psych:  depressed affect and anxious.  Tearful   EKG  Procedure date:  06/05/2009  Findings:      Normal sinus rhythm with rate of: 93 bpm. Non-specific ST-T wave changes noted.    Impression & Recommendations:  Problem # 1:  ATHEROSCLEROTIC CARDIOVASCULAR DISEASE-CHEST PAIN (ICD-429.2) She continues to experience chest pain but it is usually related to stress and anxiety.  She is complaining of overall fatigue and lack of energy. With recent cath reviewed.  Will continue to treat medically at this time.  This pain does not appear to be cardiac in etiology at this time.  If her pain continues, we can certainly  repeat further testing.  Problem # 2:  HYPERTENSION, BENIGN (ICD-401.1) Assessment: Unchanged  Her updated medication list for this problem includes:    Aspirin 81 Mg Tbec (Aspirin) ..... Once daily    Amlodipine Besylate 2.5 Mg Tabs (Amlodipine besylate) .Marland Kitchen... Take 1 tablet once daily    Chlorthalidone 25 Mg Tabs (Chlorthalidone) .Marland Kitchen... Take one  half tablet by mouth daily  Orders: T-Basic Metabolic Panel [id number])  Problem # 3:  DEPRESSION (ICD-311) She is clearly very unhappy and tearful.  I have listened and offered reassurance.  I have provided the name of a therapist.  I have advised that if she chose to  do so, speak with trusted friend, pastor, or family member if she did not wish to seek professional counseling.  Problem # 4:  CLAUDICATION (ICD-443.9) ABI's will be ordered to evaluae for PAD.  Other Orders: Arterial Duplex Lower Extremity/ABI (Arterial Dup Lower E)  Patient Instructions: 1)  Your physician recommends that you schedule a follow-up appointment in: 1 month 2)  Your physician recommends that you return for lab work in: today 3)  Your physician has requested that you have an ankle brachial index (ABI). During this test an ultrasound and blood pressure cuff are used to evaluate the arteries that supply the arms and legs with blood. Allow thirty minutes for this exam. There are no restrictions or special instructions.

## 2010-04-22 NOTE — Miscellaneous (Signed)
Summary: cbc,flp,lft,tsh,t4,hgba1c per Dr. Renette Butters  Clinical Lists Changes  Observations: Added new observation of CALCIUM: 9.5 mg/dL (47/82/9562 13:08) Added new observation of ALBUMIN: 4.3 g/dL (65/78/4696 29:52) Added new observation of PROTEIN, TOT: 7.2 g/dL (84/13/2440 10:27) Added new observation of SGPT (ALT): 26 units/L (05/30/2009 14:50) Added new observation of SGOT (AST): 20 units/L (05/30/2009 14:50) Added new observation of ALK PHOS: 76 units/L (05/30/2009 14:50) Added new observation of BILI DIRECT: total bili  0.5 mg/dL (25/36/6440 34:74) Added new observation of GFR AA: >59 mL/min/1.63m2 (05/30/2009 14:50) Added new observation of GFR: >59 mL/min (05/30/2009 14:50) Added new observation of CREATININE: 0.80 mg/dL (25/95/6387 56:43) Added new observation of BUN: 16 mg/dL (32/95/1884 16:60) Added new observation of BG RANDOM: 89 mg/dL (63/03/6008 93:23) Added new observation of CO2 PLSM/SER: 25 meq/L (05/30/2009 14:50) Added new observation of CL SERUM: 101 meq/L (05/30/2009 14:50) Added new observation of K SERUM: 4.0 meq/L (05/30/2009 14:50) Added new observation of NA: 143 meq/L (05/30/2009 14:50) Added new observation of LDL: 71 mg/dL (55/73/2202 54:27) Added new observation of HDL: 57 mg/dL (09/13/7626 31:51) Added new observation of TRIGLYC TOT: 117 mg/dL (76/16/0737 10:62) Added new observation of CHOLESTEROL: 151 mg/dL (69/48/5462 70:35) Added new observation of MCV: 292 fL (05/30/2009 14:50) Added new observation of HCT: 42.2 % (05/30/2009 14:50) Added new observation of HGB: 14.1 g/dL (00/93/8182 99:37) Added new observation of WBC COUNT: 6.8 10*3/microliter (05/30/2009 14:50) Added new observation of TSH: 2.620 microintl units/mL (05/30/2009 14:50) Added new observation of T4, FREE: 10.6 ng/dL (16/96/7893 81:01) Added new observation of HGBA1C: 6.3 % (05/30/2009 14:50)

## 2010-04-22 NOTE — Progress Notes (Signed)
  Phone Note From Pharmacy   Caller: [facility] Call For: RN  Request: Resend Prescription Details for Reason: interaction between verapamil and simvastatin Details of Request: medications substitution Initial call taken by: Tammy Sanders  Follow-up for Phone Call        Substitute pravastatin 80 mg q.d. for simvastatin Fasting lipid profile in one month Follow-up by: Kathlen Brunswick, MD, [facility],  February 12, 2010 5:46 PM    New/Updated Medications: PRAVASTATIN SODIUM 80 MG TABS (PRAVASTATIN SODIUM) Take one tablet by mouth daily at bedtime Prescriptions: PRAVASTATIN SODIUM 80 MG TABS (PRAVASTATIN SODIUM) Take one tablet by mouth daily at bedtime  #30 x 6   Entered by:   Teressa Lower RN   Authorized by:   Kathlen Brunswick, MD, [facility]   Signed by:   Teressa Lower RN on 02/14/2010   Method used:   Electronically to        [facility] (retail)       [address]       [place], [place]  [address]       Ph: [phone]       Fax: [phone]   RxID:   [id number]   Appended Document:  LMOM regarding labwork and med changes  Appended Document:  spoke with pt this am, verbalized understanding and sent labwork to pt

## 2010-04-22 NOTE — Letter (Signed)
Summary: [place] [employer] [facility] [employer], [employer] at [employer]  [address] [place] [address]   Phone: [phone]  Fax: [phone]     October 25, 2009 MRN: [id number]   [name] [name] [address] [place], [place]  [address]      YOUR LAB WORK IS DUE  The week of April 21, 2010 _________________________________________  Please go to [facility], located across the street from [facility] on the second floor.  Hours are Monday - Friday 7am until 7:30pm         Saturday 8am until 12noon    __  DO NOT EAT OR DRINK AFTER MIDNIGHT EVENING PRIOR TO LABWORK  _X_ YOUR LABWORK IS NOT FASTING --YOU MAY EAT PRIOR TO LABWORK

## 2010-04-22 NOTE — Letter (Signed)
Summary: medical release  medical release   Imported By: Lind Guest 07/01/2009 13:04:55  _____________________________________________________________________  External Attachment:    Type:   Image     Comment:   External Document

## 2010-04-22 NOTE — Progress Notes (Signed)
Summary: Return Phone Call  Phone Note Call from Patient   Caller: Patient Reason for Call: Talk to Nurse Summary of Call: pt wants to speak with Tammy regarding disability/tg Initial call taken by: Raechel Ache [facility],  March 25, 2009 3:18 PM  Follow-up for Phone Call        pt discussed less  chest pain since not working, but has weakness and chest pain with exertion Follow-up by: Teressa Lower RN,  March 25, 2009 3:43 PM

## 2010-05-09 ENCOUNTER — Other Ambulatory Visit: Payer: Self-pay | Admitting: Internal Medicine

## 2010-05-09 DIAGNOSIS — N6320 Unspecified lump in the left breast, unspecified quadrant: Secondary | ICD-10-CM

## 2010-05-23 ENCOUNTER — Ambulatory Visit
Admission: RE | Admit: 2010-05-23 | Discharge: 2010-05-23 | Disposition: A | Discharge status: Home / Self Care | Payer: BC Managed Care – PPO | Source: Ambulatory Visit | Attending: Internal Medicine | Admitting: Internal Medicine

## 2010-05-23 ENCOUNTER — Other Ambulatory Visit: Payer: Self-pay | Admitting: Internal Medicine

## 2010-05-23 DIAGNOSIS — N6320 Unspecified lump in the left breast, unspecified quadrant: Secondary | ICD-10-CM

## 2010-07-16 ENCOUNTER — Other Ambulatory Visit: Payer: Self-pay | Admitting: Cardiology

## 2010-07-16 NOTE — Telephone Encounter (Signed)
Pt is due for follow up
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
## 2010-08-05 NOTE — Cardiovascular Report (Signed)
NAME:  KRISA, BLATTNER          ACCOUNT NO.:  [id number]   MEDICAL RECORD NO.:  [id number]           PATIENT TYPE:   LOCATION:                                 FACILITY:   PHYSICIAN:  Veverly Fells. Excell Seltzer, MD  DATE OF BIRTH:  03/14/1946   DATE OF PROCEDURE:  09/10/2008  DATE OF DISCHARGE:                            CARDIAC CATHETERIZATION   PROCEDURES:  1. Left heart catheterization.  2. Selective coronary angiography.  3. Left ventricular angiography.  4. Abdominal aortic angiography.   INDICATION:  Ms. Rovira is a 65 year old woman with multiple cardiac  risk factors.  She has known PAD and previous abdominal aortic aneurysm  repair.  She has had chest pain and was evaluated with a stress  echocardiogram that showed inferolateral ischemia.  She was referred for  cardiac catheterization.   Risks and indications of the procedure were reviewed with the patient,  informed consent was obtained.  The right groin was prepped, draped, and  anesthetized with 1% lidocaine.  Using the modified Seldinger technique,  a 4-French sheath was placed in the right femoral artery.  Standard 4-  French Judkins catheters were used for coronary angiography.  Left  ventriculography was performed with an angled pigtail catheter.  The  pigtail catheter was brought back into the suprarenal abdominal aorta,  where an abdominal aortogram was performed.  The patient tolerated the  procedure well.  All catheter exchanges were performed over a guidewire.  There were no immediate complications.   FINDINGS:  Aortic pressure 150/80 with a mean of 112, left ventricular  pressure 154/24.   Left ventriculography shows normal left ventricular systolic function.  The LVEF is estimated at 55%.  There were no regional wall motion  abnormalities.   Abdominal aortography shows a widely patent abdominal aorta.  There are  patent renal arteries bilaterally.  There are no significant stenoses in  the iliac arteries.   Both common internal and external iliac arteries  are patent.   Coronary angiography:  The left main coronary artery is patent.  There  is no significant stenosis present.  The left main divides into the LAD,  ramus intermedius, and left circumflex.  There is mild plaque at the  most distal portion of the left main.   LAD:  The LAD is a moderate-sized vessel that courses down and reaches  the LV apex.  There is mild calcification.  There is nonobstructive  plaque in the proximal LAD in the range of 40-50%.  The mid and distal  portions of the LAD have no significant stenosis.   Ramus intermedius:  The ramus is a small vessel.  There is a tight  proximal stenosis in the range of 90%.  The vessel distally branches  into multiple very small segmental branches.  The diameter of the  intermediate is estimated at 1.5 mm or less.   Left circumflex:  Left circumflex courses down and supplies 2 OM  branches.  There are no significant stenoses present.  The AV groove  circumflex is small beyond the origin of the obtuse marginal branches.   Right coronary artery:  The right coronary  artery is dominant.  It is  widely patent throughout its course.  It supplies a small conus branch  and a moderate-sized RV marginal branch and then gives off a PDA and  posterolateral branch.   ASSESSMENT:  1. Severe single-vessel coronary artery disease involving a small      ramus intermedius branch.  2. Patent left anterior descending with nonobstructive proximal      stenosis as outlined.  3. Patent left circumflex and right coronary arteries.  4. Normal left ventricular function.   RECOMMENDATIONS:  The patient's intermediate branch is very small, and I  would favor medical therapy because the vessel is of such a small  caliber.  There are no other significant stenoses in the main branches  of the LAD, left circumflex, and right coronary arteries.      Veverly Fells. Excell Seltzer, MD  Electronically  Signed     MDC/MEDQ  D:  09/10/2008  T:  09/11/2008  Job:  [id number]   cc:   Gerrit Friends. Dietrich Pates, MD, [facility]  Franchot Heidelberg, M.D.

## 2010-08-05 NOTE — Letter (Signed)
October 01, 2008    Franchot Heidelberg, MD   RE:  [name] [name], [name] [name]  MRN:  [id number]  /  DOB:  1945/08/25   Dear Remi Haggard,   [name] [name] returns to the office for continued assessment and  treatment of chest discomfort and now coronary artery disease.  Cardiac  catheterization revealed a 90% lesion in a small ramus intermedius  branch of the circumflex and a 50% stenosis in the left anterior  descending.  Percutaneous intervention was not technically feasible.  She has been stable since that test with intermittent chest discomfort  but more prominent symptoms of presyncope.  These occur unpredictably 5  to 6 times per day and lasts for a matter of seconds.  She does not note  palpitations.   She carries a history of hypertension, but blood pressure has been  normal of late off any antihypertensive medication.  Her lipid profile  was suboptimal when last assessed.   Current medications include:  1. Aspirin 81 mg daily.  2. Promethazine 25 mg p.r.n.  3. Klonopin 0.5 mg b.i.d.  4. NTG p.r.n.   PHYSICAL EXAMINATION:  GENERAL:  Pleasant woman in no acute distress.  VITAL SIGNS:  Weight is 156, stable.  Blood pressure 135/70, heart rate  65 and regular, respirations 12 and unlabored.  NECK:  No jugular venous distention; no carotid bruits.  LUNGS:  Clear.  CARDIAC:  Normal first and second heart sounds.  ABDOMEN:  Soft and nontender; no bruits; no organomegaly.  EXTREMITIES:  No edema.   IMPRESSION:  [name] [name] is doing generally well.  It remains unclear  whether chest discomfort is related to coronary disease.  I will treat  her with amlodipine 5 mg daily, which will also further lower her blood  pressure.  Due to symptoms of presyncope, a Holter monitor will be  performed.  She requires additional therapy for hyperlipidemia.  Simvastatin will be started at a dose of 40 mg daily and fish oil at 2  capsules daily.  A chemistry profile and lipid profile will be obtained  in 1 month.  I will reassess this nice woman in 5 weeks.    Sincerely,      Gerrit Friends. Dietrich Pates, MD, [facility]  Electronically Signed    RMR/MedQ  DD: 10/01/2008  DT: 10/02/2008  Job #: [id number]

## 2010-08-05 NOTE — Op Note (Signed)
NAME:  [name] [name]          ACCOUNT NO.:  [id number]   MEDICAL RECORD NO.:  [id number]          PATIENT TYPE:  AMB   LOCATION:  DAY                           FACILITY:  [facility]   PHYSICIAN:  J. Darreld Mclean, M.D. DATE OF BIRTH:  [birth year]   DATE OF PROCEDURE:  DATE OF DISCHARGE:                               OPERATIVE REPORT   PREOPERATIVE DIAGNOSIS:  Tear, right knee medial meniscus.   POSTOPERATIVE DIAGNOSIS:  Tear, right knee medial meniscus.   PROCEDURE:  Operative arthroscopy of the right knee with partial medial  meniscectomy.   ANESTHESIA:  General.   SURGEON:  J. Darreld Mclean, M.D.   DRAINS:  No drains.   TOURNIQUET TIME:  22 minutes.   INDICATIONS FOR PROCEDURE:  The patient is a 65 year old female who has  pain and tenderness in her right knee.  She has had some giving way.  It  has gotten progressively worse over the last several months.  Dr.  Erby Pian obtained an MRI of the right knee in October showing a tear in  the posterior portion of the medial meniscus.  She has gotten worse,  conservative treatment has not succeeded.  I recommended arthroscopy.  I  went over the risks and imponderables of the procedure preoperatively  with her and her husband.  They asked appropriate questions.  Agreed to  the procedure.   DESCRIPTION OF PROCEDURE:  The patient was seen in the holding area and  the right knee was identified as the correct surgical site.  I placed a  mark on the right knee.  She placed a mark on the right knee.  The  patient was brought to the operating room, placed supine and given  general anesthesia.  Tourniquet leg holder placed deflated right upper  thigh.  She was prepped and draped in the usual manner.  A time-out  identifying [name] [name] as the patient, the right knee as the correct  surgical site.  The leg was elevated, wrapped circumferentially with an  Esmarch bandage, tourniquet was inflated to 300 mmHg.  Esmarch bandage  removed.   Inflow cannula was inserted medially and lactated Ringer's  instilled into the knee by an infusion pump.  Arthroscope inserted  laterally.  The knee was systematically examined.  The suprapatellar  pouch had some mild synovitis, grade 2 changes around the patella.  Medially there was tear in the posterior horn of the medial meniscus  with grade 2 to 3 changes on the femoral condyle and tibial plateau.  The anterior cruciate was intact.  Laterally grade 2 changes with some  slight fraying of the lateral meniscus but the meniscus was not torn.  There were no loose bodies.   Attention was directed back to the medial side and using a meniscal  punch and meniscal shaver, a good smooth contour was obtained and the  posterior portion of the meniscus was removed.  Also using the meniscal  knife we removed a large portion.  Permanent pictures were taken.  The  knee was systematically reexamined and no new pathology found.   The wounds were then reapproximated using 3-0 nylon interrupted  vertical  mattress manner.  Marcaine 0.25% instilled into each portal.  Tourniquet  deflated after 22 minutes.  Sterile dressing applied.  Bulky dressing  applied.  The patient moved to the recovery room in good condition.   A prescription for Darvocet-N 100 given for pain.  She has therapy set  up for next week.  If she has any difficulty over the weekend she is to  call through the hospital and call the office during office hours.  The  office will be closed Monday for [employer] Day.           ______________________________  Shela Commons. Darreld Mclean, M.D.     JWK/MEDQ  D:  04/07/2007  T:  04/07/2007  Job:  [id number]   cc:   Franchot Heidelberg, M.D.

## 2010-08-05 NOTE — H&P (Signed)
NAME:  [name] [name], [name] [name]          ACCOUNT NO.:  [id number]   MEDICAL RECORD NO.:  [id number]          PATIENT TYPE:  AMB   LOCATION:  DAY                           FACILITY:  [facility]   PHYSICIAN:  J. Darreld Mclean, M.D. DATE OF BIRTH:  March 27, 1945   DATE OF ADMISSION:  DATE OF DISCHARGE:  LH                              HISTORY & PHYSICAL   CHIEF COMPLAINT:  My knee hurts on the right, it is giving away.   The patient is a 65 year old female I first saw in the office on October  14.  She had been having pain in her knee on the right side,  particularly after working in the garden during the end of the summer.  She saw Dr. Erby Pian concerning this.  He obtained an MRI of the right  knee October 2.  MRI showed degenerative undersurface tear of the  posterior horn of the medial meniscus and some mild DJD.  I explained  the findings to her and injected her knee at that time with Depo-Medrol.  She cannot take anti-inflammatories by mouth secondary to stomach  irritation.  She did well initially but over the last several months her  knee is getting progressively worse, giving away, painful, swelling,  tender.  She says she started hurting and would like to have something  done there.  She has no other joint pains.   PAST HISTORY:  Positive for ulcer disease.  She denies heart disease,  lung disease, kidney disease, stroke, paralysis, weakness, hypertension,  diabetes, TB, cancer, circulatory problems.   Allergic to CODEINE, ASPIRIN and PENICILLIN.   She is taking Tylenol, 81 mg of aspirin a day, and Zoloft 100 mg twice a  day.   She does not smoke.  She does not use alcohol.   She is status post hysterectomy 1974, jaw surgery in 1986.  She had a  procedure for a rotator cuff in 2002.   She denies any diseases that run in the family.   The patient lives in [place] and is married.   This patient's is 110/70, pulse 72, respirations 16, afebrile, 5 feet  even, 153 pounds.  She is  alert and cooperative, oriented.  HEENT: Negative.  NECK:  Supple.  LUNGS:  Clear to P&A.  HEART:  Regular without murmur heard.  She has effusion of the right knee, slight crepitus and pain over the  medial joint line with a weakly positive McMurray.  Other extremities  are negative.  CNS: Intact.  SKIN:  Intact.   IMPRESSION:  1. Medial meniscal tear, right knee.  2. History of ulcer disease.   PLAN:  Operative arthroscopy of the right knee.  I have discussed with  the patient and her husband the risks and imponderables of the  procedure.  Labs are pending.                                            ______________________________  J. Darreld Mclean, M.D.     JWK/MEDQ  D:  04/06/2007  T:  04/06/2007  Job:  [id number]

## 2010-08-05 NOTE — Letter (Signed)
September 05, 2008    Franchot Heidelberg, MD  [address], [address]  [place], [place] [address]   RE:  ITSEL, OPFER  MRN:  [id number]  /  DOB:  Dec 29, 1945   Dear Remi Haggard:   Ms. Herbison returned to the office today for further evaluation of  chest discomfort.  She continues to have symptoms and these continue to  be atypical.  They may be more associated with emotional upset or anger.  She feels her heart beating fast and develops a variety of symptoms  including chest discomfort, left shoulder discomfort, mid back  discomfort, bilateral neck discomfort and perhaps some jaw discomfort.  There is no associated dyspnea nor diaphoresis.  The symptoms last a  matter of minutes.  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  Cardiovascular risk factors include a history of hypertension and  hyperlipidemia.   PAST MEDICAL HISTORY:  1. Otherwise notable for orthopedic issues.  2. Bilateral cataract surgery.  3. Reportedly repair of an abdominal aortic aneurysm in 2002.  We have      sought to obtain those records since AAA repair in a 65 year old      woman would be relatively unusual, however, we have not been      successful in this endeavor.  4. Ms. Poehlman also has a history of DJD with chronic low back pain.  5. GERD.  6. Anxiety/depression.   ALLERGIES:  1. PENICILLIN.  2. CODEINE.   CURRENT MEDICATIONS:  1. Aspirin 81 mg daily.  2. Promethazine p.r.n.  3. Klonopin p.r.n.  4. Sublingual nitroglycerin p.r.n.   SOCIAL HISTORY:  Married with 2 adult children.  Typically walks a few  miles a day without difficulty.  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  No use of tobacco nor alcohol products.   FAMILY HISTORY:  Positive for coronary artery disease.  Her father died  at age 30 due to myocardial infarction.  However, she has 4 siblings who  are alive and well.   REVIEW OF SYSTEMS:   Notable for intermittent headaches.  The need for  corrective lenses for near vision.  Modest hearing loss.  Partial upper  dentures.  Occasional palpitations.   PHYSICAL EXAMINATION:  GENERAL:  Very pleasant woman in no acute  distress.  VITAL SIGNS:  Weight is 157 pounds, 1 pound more than at her last visit,  blood pressure 130/65, heart rate 70 and regular, respirations 14 and  unlabored.  NECK:  No jugular venous distention.  No carotid bruits.  LUNGS:  Clear.  CARDIAC:  Normal first and second heart sounds.  Modest systolic  ejection murmur.  ABDOMEN:  Soft, nontender.  No organomegaly.  NEUROLOGIC:  Symmetric strength and tone.  Normal cranial nerves.  Normal higher intergrade of functions and affect.  EXTREMITIES:  No edema.  Normal distal pulses.   DIAGNOSTICS:  1. EKG from her last visit showed normal sinus rhythm, right bundle      branch block and nondiagnostic lateral Q-waves.  2. A stress echocardiogram was performed.  The patient had poor      exercise tolerance.  3. EKGs were negative.  However, the echocardiographic images      suggested ischemia in the inferolateral segment.   LABORATORY DATA:  Recent normal laboratories include a CBC, chemistry  profile and hepatic profile.  Glucose was mildly elevated at 125.  Lipids were moderately elevated.   IMPRESSION:  Ms. Vitale continues to be symptomatic.  She had an  abnormal stress echocardiogram suggesting the presence of ischemia.  I  discussed the risks and benefits of cardiac catheterization with Ms.  Kneeland.  She is somewhat concerned about the potential  adverse outcomes, but is reassured by their low frequency.  I do not  believe that we can provide appropriate care for her ongoing symptoms  without a definite diagnosis.  We will proceed with outpatient  catheterization as soon as the patient is able to do so.  I will let you  know the results of that study when I see her again immediately post-   procedure.    Sincerely,      Gerrit Friends. Dietrich Pates, MD, [facility]  Electronically Signed    RMR/MedQ  DD: 09/05/2008  DT: 09/05/2008  Job #: [id number]

## 2010-08-08 NOTE — H&P (Signed)
NAME:  [name] [name], CHANDONNET          ACCOUNT NO.:  [id number]   MEDICAL RECORD NO.:  [id number]         PATIENT TYPE:  PAMB   LOCATION:                                FACILITY:  [facility]   PHYSICIAN:  Dalia Heading, M.D.  DATE OF BIRTH:  03-21-1946   DATE OF ADMISSION:  DATE OF DISCHARGE:  LH                                HISTORY & PHYSICAL   CHIEF COMPLAINT:  Peptic ulcer disease.   HISTORY OF PRESENT ILLNESS:  The patient is a 65 year old white female who  was referred for endoscopic evaluation.  She needs an EGD for history of  peptic ulcer disease.  She had a bleeding ulcer with perforation in 2003.  She was on PPI treatment, but then was taken off of it by a physician.  She  recently saw a new physician who put her back on the  Protonix.  She has  been having worsening upper abdominal pain.  No weight loss, nausea,  vomiting, diarrhea, constipation, melena, or hematochezia have been noted.  She last had a colonoscopy 1 year ago a [facility].  She does have a  history of polyps and irritable bowel disease.  There is no family history  of colon carcinoma.   PAST MEDICAL HISTORY:  As noted above, depression.   PAST SURGICAL HISTORY:  1.  Hysterectomy.  2.  Repair peptic ulcer.  3.  Jaw replacement.  4.  Rotator cuff repair.   CURRENT MEDICATIONS:  1.  Zoloft 100 mg p.o. daily.  2.  Protonix 40 mg p.o. daily.   ALLERGIES:  CODEINE, ASPIRIN.   REVIEW OF SYSTEMS:  Noncontributory.   PHYSICAL EXAMINATION:  GENERAL:  The patient is a well-developed, well-  nourished white female in no acute distress.  LUNGS:  Clear to auscultation with equal breath sounds bilaterally.  HEART EXAMINATION:  Reveals regular rate and rhythm without S3-S4, or  murmurs.  ABDOMEN:  Soft, nontender, nondistended.  No hepatosplenomegaly or masses  are noted.   IMPRESSION:  Peptic ulcer disease.   PLAN:  The patient is scheduled for an EGD on 08/26/2005.  The risks and  benefits of the  procedure including bleeding and perforation were fully  explained to the patient, who gave informed consent.      Dalia Heading, M.D.  Electronically Signed     MAJ/MEDQ  D:  08/25/2005  T:  08/25/2005  Job:  [id number]   cc:   Rosario Jacks  Fax: [phone]

## 2010-09-30 ENCOUNTER — Other Ambulatory Visit: Payer: Self-pay | Admitting: Cardiology

## 2010-09-30 NOTE — Telephone Encounter (Signed)
..   Requested Prescriptions   Pending Prescriptions Disp Refills  . chlorthalidone (HYGROTON) 25 MG tablet 90 tablet 3    Sig: Take 1 tablet (25 mg total) by mouth daily.  . pravastatin (PRAVACHOL) 80 MG tablet 90 tablet 3    Sig: Take 1 tablet (80 mg total) by mouth every evening.  . verapamil (CALAN) 120 MG tablet 90 tablet 3    Sig: Take 1 tablet (120 mg total) by mouth 3 (three) times daily.   Pt is going to be loosing insurance at end of week. Pt would like to have a 90 day supply on all prescribe meds listed to carry her until she can get her new insurance card. Pt is on as a dependent of husband insurance.

## 2010-10-01 MED ORDER — PRAVASTATIN SODIUM 80 MG PO TABS: 80.0000 mg | ORAL_TABLET | Freq: Every evening | ORAL | Status: DC

## 2010-10-01 MED ORDER — VERAPAMIL HCL 120 MG PO TABS: 120.0000 mg | ORAL_TABLET | Freq: Three times a day (TID) | ORAL | Status: DC

## 2010-10-01 MED ORDER — CHLORTHALIDONE 25 MG PO TABS: 25.0000 mg | ORAL_TABLET | Freq: Every day | ORAL | Status: DC

## 2010-11-05 ENCOUNTER — Telehealth: Payer: Self-pay | Admitting: Cardiology

## 2010-11-05 NOTE — Telephone Encounter (Signed)
Patient states that she went to pick her BP meds up and was given wrong medicine / states that Pharmacist told her that Dr.Rothbart changed the medicine / tg

## 2010-12-11 LAB — DIFFERENTIAL
Basophils Absolute: 0
Basophils Relative: 0
Eosinophils Absolute: 0.1
Neutrophils Relative %: 69

## 2010-12-11 LAB — URINALYSIS, ROUTINE W REFLEX MICROSCOPIC
Nitrite: NEGATIVE
Protein, ur: NEGATIVE
Urobilinogen, UA: 0.2

## 2010-12-11 LAB — COMPREHENSIVE METABOLIC PANEL
ALT: 18
Alkaline Phosphatase: 58
BUN: 14
CO2: 25
Calcium: 9.5
GFR calc non Af Amer: >60
Glucose, Bld: 94
Potassium: 4.4
Sodium: 139

## 2010-12-11 LAB — CBC
HCT: 38.2
Hemoglobin: 12.8
MCHC: 33.6
RBC: 4.24

## 2010-12-17 ENCOUNTER — Ambulatory Visit (INDEPENDENT_AMBULATORY_CARE_PROVIDER_SITE_OTHER): Payer: BC Managed Care – PPO | Admitting: Physician Assistant

## 2010-12-17 ENCOUNTER — Ambulatory Visit: Payer: BC Managed Care – PPO | Admitting: Cardiology

## 2010-12-17 ENCOUNTER — Ambulatory Visit: Payer: BC Managed Care – PPO | Admitting: Adult Health

## 2010-12-17 ENCOUNTER — Encounter: Payer: Self-pay | Admitting: Physician Assistant

## 2010-12-17 DIAGNOSIS — E789 Disorder of lipoprotein metabolism, unspecified: Secondary | ICD-10-CM

## 2010-12-17 DIAGNOSIS — F329 Major depressive disorder, single episode, unspecified: Secondary | ICD-10-CM

## 2010-12-17 DIAGNOSIS — I2510 Atherosclerotic heart disease of native coronary artery without angina pectoris: Secondary | ICD-10-CM

## 2010-12-17 DIAGNOSIS — I10 Essential (primary) hypertension: Secondary | ICD-10-CM

## 2010-12-17 DIAGNOSIS — E785 Hyperlipidemia, unspecified: Secondary | ICD-10-CM
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
## 2010-12-17 NOTE — Assessment & Plan Note (Signed)
Patient is walking/running 1/2 miles per day without chest pain. She is doing well.

## 2010-12-17 NOTE — Patient Instructions (Signed)
Your physician recommends that you schedule a follow-up appointment in: January with Dr Dietrich Pates Your physician recommends that you return for lab work in: January before your appointment

## 2010-12-17 NOTE — Assessment & Plan Note (Signed)
She is due for a fasting lipid panel and LFTs in January.
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
## 2010-12-17 NOTE — Assessment & Plan Note (Signed)
Blood pressure well controlled

## 2011-02-20 ENCOUNTER — Telehealth: Payer: Self-pay | Admitting: Cardiology

## 2011-02-20 NOTE — Telephone Encounter (Signed)
Spoke with patient's husband regarding verapamil dosing and the fact that they have caused her nausea since they were prescribed.  Advised her to take with a soda cracker and take 120 mg as prescribed.  Per Dr Dietrich Pates, will see her in office to reassess medications and change if necessary.

## 2011-02-20 NOTE — Telephone Encounter (Signed)
Patient states that patient is taking "different medicine and it is not reacting well with her".  States that she was supposed to be taking Verapamil 120mg  and had the wrong RX and called up here and was told "to just take it but cut it in half".  Even though it was the wrong RX. Please return call to patient's husband. / tg

## 2011-02-24 ENCOUNTER — Encounter: Payer: Self-pay | Admitting: Adult Health

## 2011-02-24 ENCOUNTER — Ambulatory Visit (INDEPENDENT_AMBULATORY_CARE_PROVIDER_SITE_OTHER): Payer: BC Managed Care – PPO | Admitting: Adult Health

## 2011-02-24 VITALS — BP 132/85 | HR 84 | Ht 60.0 in | Wt 148.0 lb

## 2011-02-24 DIAGNOSIS — I10 Essential (primary) hypertension: Secondary | ICD-10-CM

## 2011-02-24 MED ORDER — AMLODIPINE BESYLATE 2.5 MG PO TABS: 2.5000 mg | ORAL_TABLET | Freq: Every day | ORAL | Status: DC
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
## 2011-02-24 NOTE — Patient Instructions (Signed)
Your physician recommends that you schedule a follow-up appointment in: 3 months   Your physician has recommended you make the following change in your medication:  STOP Verapamil Start Norvasc (Amlodipine) 2.5 mg daily

## 2011-02-24 NOTE — Assessment & Plan Note (Signed)
Since she is unable to tolerate full dose verapamil, I will discontinue the verapamil altogether. Will return her to amlodipine 2.5 mg as she was taking prior the the medication change. She is adamant that she wishes to finish the current bottle of verapamil as she just bought it, and will continue to quarter the pills. I have advised against this, but she will continue this medication until is runs out and then start the amlodipine.We will see her in 3 months.

## 2011-04-14 ENCOUNTER — Other Ambulatory Visit: Payer: Self-pay | Admitting: Internal Medicine

## 2011-04-14 DIAGNOSIS — Z1231 Encounter for screening mammogram for malignant neoplasm of breast: Secondary | ICD-10-CM

## 2011-05-25 ENCOUNTER — Ambulatory Visit
Admission: RE | Admit: 2011-05-25 | Discharge: 2011-05-25 | Disposition: A | Discharge status: Home / Self Care | Payer: BC Managed Care – PPO | Source: Ambulatory Visit | Attending: Internal Medicine | Admitting: Internal Medicine

## 2011-05-25 DIAGNOSIS — Z1231 Encounter for screening mammogram for malignant neoplasm of breast: Secondary | ICD-10-CM

## 2011-06-08 ENCOUNTER — Ambulatory Visit: Payer: BC Managed Care – PPO | Admitting: Cardiology

## 2011-11-20 ENCOUNTER — Other Ambulatory Visit: Payer: Self-pay | Admitting: Cardiology

## 2012-01-08 ENCOUNTER — Other Ambulatory Visit: Payer: Self-pay | Admitting: Cardiology

## 2012-04-27 ENCOUNTER — Other Ambulatory Visit: Payer: Self-pay | Admitting: Internal Medicine

## 2012-04-27 DIAGNOSIS — Z1231 Encounter for screening mammogram for malignant neoplasm of breast: Secondary | ICD-10-CM

## 2012-04-30 ENCOUNTER — Other Ambulatory Visit: Payer: Self-pay | Admitting: Adult Health

## 2012-05-02 ENCOUNTER — Telehealth: Payer: Self-pay | Admitting: *Deleted

## 2012-05-02 NOTE — Telephone Encounter (Signed)
Contacted patient due to a refill request, as she is past due for follow up.  She refused any further appointments, therefore advised her that all future refills would need to be completed via PCP.  She verbalized understanding.

## 2012-05-18 ENCOUNTER — Other Ambulatory Visit (HOSPITAL_COMMUNITY): Payer: Self-pay | Admitting: Cardiology

## 2012-05-18 ENCOUNTER — Other Ambulatory Visit: Payer: Self-pay | Admitting: Adult Health

## 2012-05-30 ENCOUNTER — Other Ambulatory Visit: Payer: Self-pay | Admitting: Internal Medicine

## 2012-06-16 ENCOUNTER — Ambulatory Visit (INDEPENDENT_AMBULATORY_CARE_PROVIDER_SITE_OTHER): Payer: Medicare Other | Admitting: Otolaryngology

## 2012-06-16 DIAGNOSIS — R040 Epistaxis: Secondary | ICD-10-CM

## 2012-07-05 ENCOUNTER — Ambulatory Visit
Admission: RE | Admit: 2012-07-05 | Discharge: 2012-07-05 | Disposition: A | Discharge status: Home / Self Care | Payer: Medicare Other | Source: Ambulatory Visit | Attending: Internal Medicine | Admitting: Internal Medicine

## 2012-07-05 DIAGNOSIS — Z780 Asymptomatic menopausal state: Secondary | ICD-10-CM

## 2012-07-05 DIAGNOSIS — Z1231 Encounter for screening mammogram for malignant neoplasm of breast: Secondary | ICD-10-CM

## 2012-07-07 ENCOUNTER — Ambulatory Visit (INDEPENDENT_AMBULATORY_CARE_PROVIDER_SITE_OTHER): Payer: Medicare Other | Admitting: Otolaryngology

## 2012-07-07 DIAGNOSIS — R040 Epistaxis: Secondary | ICD-10-CM

## 2013-05-29 ENCOUNTER — Other Ambulatory Visit: Payer: Self-pay

## 2013-05-29 DIAGNOSIS — Z1231 Encounter for screening mammogram for malignant neoplasm of breast: Secondary | ICD-10-CM

## 2013-06-13 ENCOUNTER — Encounter: Payer: Self-pay | Admitting: *Deleted

## 2013-07-06 ENCOUNTER — Ambulatory Visit
Admission: RE | Admit: 2013-07-06 | Discharge: 2013-07-06 | Disposition: A | Discharge status: Home / Self Care | Payer: Medicare Other | Source: Ambulatory Visit

## 2013-07-06 DIAGNOSIS — Z1231 Encounter for screening mammogram for malignant neoplasm of breast: Secondary | ICD-10-CM

## 2013-07-07 ENCOUNTER — Other Ambulatory Visit: Payer: Self-pay | Admitting: Internal Medicine

## 2013-07-07 DIAGNOSIS — R928 Other abnormal and inconclusive findings on diagnostic imaging of breast: Secondary | ICD-10-CM

## 2013-07-13 ENCOUNTER — Encounter (INDEPENDENT_AMBULATORY_CARE_PROVIDER_SITE_OTHER): Payer: Self-pay

## 2013-07-13 ENCOUNTER — Ambulatory Visit (INDEPENDENT_AMBULATORY_CARE_PROVIDER_SITE_OTHER): Payer: Medicare Other | Admitting: Gastroenterology

## 2013-07-13 ENCOUNTER — Encounter: Payer: Self-pay | Admitting: Gastroenterology

## 2013-07-13 VITALS — BP 120/70 | HR 79 | Temp 97.6°F | Ht 60.0 in | Wt 148.2 lb

## 2013-07-13 DIAGNOSIS — R1314 Dysphagia, pharyngoesophageal phase: Secondary | ICD-10-CM

## 2013-07-13 DIAGNOSIS — K589 Irritable bowel syndrome without diarrhea: Secondary | ICD-10-CM

## 2013-07-13 DIAGNOSIS — R1310 Dysphagia, unspecified: Secondary | ICD-10-CM | POA: Insufficient documentation

## 2013-07-13 DIAGNOSIS — K219 Gastro-esophageal reflux disease without esophagitis: Secondary | ICD-10-CM

## 2013-07-13 DIAGNOSIS — K279 Peptic ulcer, site unspecified, unspecified as acute or chronic, without hemorrhage or perforation: Secondary | ICD-10-CM

## 2013-07-13 DIAGNOSIS — K625 Hemorrhage of anus and rectum: Secondary | ICD-10-CM | POA: Insufficient documentation

## 2013-07-13 DIAGNOSIS — R1013 Epigastric pain: Secondary | ICD-10-CM | POA: Insufficient documentation

## 2013-07-13 DIAGNOSIS — R1319 Other dysphagia: Secondary | ICD-10-CM | POA: Insufficient documentation

## 2013-07-13 MED ORDER — OMEPRAZOLE 20 MG PO CPDR: 20.0000 mg | DELAYED_RELEASE_CAPSULE | Freq: Every day | ORAL | Status: DC

## 2013-07-13 MED ORDER — PEG 3350-KCL-NA BICARB-NACL 420 G PO SOLR: 4000.0000 mL | ORAL | Status: DC
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
## 2013-07-13 NOTE — Progress Notes (Signed)
cc'd to pcp 

## 2013-07-13 NOTE — Assessment & Plan Note (Signed)
Plan on colonoscopy in near future.  I have discussed the risks, alternatives, benefits with regards to but not limited to the risk of reaction to medication, bleeding, infection, perforation and the patient is agreeable to proceed. Written consent to be obtained.  Patient also recently noted to have breast lump and goes tomorrow for further testing.

## 2013-07-13 NOTE — Patient Instructions (Signed)
1. Upper endoscopy and colonoscopy in the near future. Please see separate instructions. 2. I would recommend you take the omeprazole 20 mg before breakfast daily for acid reflux, possible ulcer. Prescription is sent here pharmacy. You may stop Zantac when you start this medication. 3. [facility].
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
## 2013-07-14 ENCOUNTER — Other Ambulatory Visit: Payer: Medicare Other

## 2013-07-14 ENCOUNTER — Ambulatory Visit
Admission: RE | Admit: 2013-07-14 | Discharge: 2013-07-14 | Disposition: A | Discharge status: Home / Self Care | Payer: Medicare Other | Source: Ambulatory Visit | Attending: Internal Medicine | Admitting: Internal Medicine

## 2013-07-14 DIAGNOSIS — R928 Other abnormal and inconclusive findings on diagnostic imaging of breast: Secondary | ICD-10-CM

## 2013-07-24 ENCOUNTER — Other Ambulatory Visit: Payer: Self-pay | Admitting: Internal Medicine

## 2013-07-24 ENCOUNTER — Encounter (HOSPITAL_COMMUNITY): Payer: Self-pay | Admitting: Pharmacy Technician

## 2013-07-24 DIAGNOSIS — K625 Hemorrhage of anus and rectum: Secondary | ICD-10-CM

## 2013-07-24 DIAGNOSIS — K279 Peptic ulcer, site unspecified, unspecified as acute or chronic, without hemorrhage or perforation: Secondary | ICD-10-CM

## 2013-07-24 DIAGNOSIS — R1310 Dysphagia, unspecified: Secondary | ICD-10-CM

## 2013-07-24 DIAGNOSIS — R1013 Epigastric pain: Secondary | ICD-10-CM

## 2013-07-24 DIAGNOSIS — R1319 Other dysphagia: Secondary | ICD-10-CM

## 2013-07-24 DIAGNOSIS — K219 Gastro-esophageal reflux disease without esophagitis: Secondary | ICD-10-CM

## 2013-07-27 ENCOUNTER — Encounter: Payer: Self-pay | Admitting: Gastroenterology

## 2013-07-27 NOTE — Progress Notes (Signed)
Reviewed records. Colonoscopy March 2005 by Dr. Sharyne Peachrew Siegel showed one 6 mm polyp in the sigmoid colon, diverticulosis. Hyperplastic polyp. No other records received.

## 2013-07-31 ENCOUNTER — Encounter (HOSPITAL_COMMUNITY): Payer: Self-pay | Admitting: *Deleted

## 2013-07-31 ENCOUNTER — Ambulatory Visit (HOSPITAL_COMMUNITY)
Admission: RE | Admit: 2013-07-31 | Discharge: 2013-07-31 | Disposition: A | Discharge status: Home / Self Care | Payer: Medicare Other | Source: Ambulatory Visit | Attending: Internal Medicine | Admitting: Internal Medicine

## 2013-07-31 ENCOUNTER — Encounter (HOSPITAL_COMMUNITY)
Admission: RE | Disposition: A | Discharge status: Home / Self Care | Payer: Self-pay | Source: Ambulatory Visit | Attending: Internal Medicine

## 2013-07-31 DIAGNOSIS — K589 Irritable bowel syndrome without diarrhea: Secondary | ICD-10-CM | POA: Insufficient documentation

## 2013-07-31 DIAGNOSIS — K2970 Gastritis, unspecified, without bleeding: Secondary | ICD-10-CM

## 2013-07-31 DIAGNOSIS — K649 Unspecified hemorrhoids: Secondary | ICD-10-CM

## 2013-07-31 DIAGNOSIS — K5730 Diverticulosis of large intestine without perforation or abscess without bleeding: Secondary | ICD-10-CM | POA: Insufficient documentation

## 2013-07-31 DIAGNOSIS — R1013 Epigastric pain: Secondary | ICD-10-CM

## 2013-07-31 DIAGNOSIS — R195 Other fecal abnormalities: Secondary | ICD-10-CM

## 2013-07-31 DIAGNOSIS — K219 Gastro-esophageal reflux disease without esophagitis: Secondary | ICD-10-CM

## 2013-07-31 DIAGNOSIS — K279 Peptic ulcer, site unspecified, unspecified as acute or chronic, without hemorrhage or perforation: Secondary | ICD-10-CM

## 2013-07-31 DIAGNOSIS — K648 Other hemorrhoids: Secondary | ICD-10-CM | POA: Insufficient documentation

## 2013-07-31 DIAGNOSIS — K2990 Gastroduodenitis, unspecified, without bleeding: Secondary | ICD-10-CM

## 2013-07-31 DIAGNOSIS — Z8711 Personal history of peptic ulcer disease: Secondary | ICD-10-CM | POA: Insufficient documentation

## 2013-07-31 DIAGNOSIS — R1310 Dysphagia, unspecified: Secondary | ICD-10-CM | POA: Insufficient documentation

## 2013-07-31 DIAGNOSIS — K2960 Other gastritis without bleeding: Secondary | ICD-10-CM | POA: Insufficient documentation

## 2013-07-31 DIAGNOSIS — K2940 Chronic atrophic gastritis without bleeding: Secondary | ICD-10-CM | POA: Insufficient documentation

## 2013-07-31 DIAGNOSIS — K625 Hemorrhage of anus and rectum: Secondary | ICD-10-CM

## 2013-07-31 DIAGNOSIS — R1319 Other dysphagia: Secondary | ICD-10-CM

## 2013-07-31 DIAGNOSIS — K921 Melena: Secondary | ICD-10-CM | POA: Insufficient documentation

## 2013-07-31 HISTORY — PX: ESOPHAGEAL DILATION: SHX303

## 2013-07-31 HISTORY — PX: COLONOSCOPY: SHX5424

## 2013-07-31 HISTORY — PX: ESOPHAGOGASTRODUODENOSCOPY: SHX5428

## 2013-07-31 SURGERY — COLONOSCOPY
Anesthesia: Moderate Sedation

## 2013-07-31 MED ORDER — LIDOCAINE VISCOUS 2 % MT SOLN
OROMUCOSAL | Status: DC | PRN
  Administered 2013-07-31: 3 mL via OROMUCOSAL

## 2013-07-31 MED ORDER — LIDOCAINE VISCOUS 2 % MT SOLN
OROMUCOSAL | Status: AC
  Filled 2013-07-31: qty 15

## 2013-07-31 MED ORDER — MIDAZOLAM HCL 5 MG/5ML IJ SOLN
INTRAMUSCULAR | Status: DC | PRN
  Administered 2013-07-31: 1 mg via INTRAVENOUS
  Administered 2013-07-31: 2 mg via INTRAVENOUS
  Administered 2013-07-31 (×6): 1 mg via INTRAVENOUS

## 2013-07-31 MED ORDER — SIMETHICONE 40 MG/0.6ML PO SUSP
ORAL | Status: DC | PRN
  Administered 2013-07-31: 11:00:00

## 2013-07-31 MED ORDER — SODIUM CHLORIDE 0.9 % IV SOLN
INTRAVENOUS | Status: DC
  Administered 2013-07-31: 10:00:00 via INTRAVENOUS

## 2013-07-31 MED ORDER — MEPERIDINE HCL 100 MG/ML IJ SOLN
INTRAMUSCULAR | Status: AC
  Filled 2013-07-31: qty 2

## 2013-07-31 MED ORDER — MEPERIDINE HCL 100 MG/ML IJ SOLN
INTRAMUSCULAR | Status: DC | PRN
  Administered 2013-07-31 (×2): 25 mg via INTRAVENOUS
  Administered 2013-07-31: 50 mg via INTRAVENOUS

## 2013-07-31 MED ORDER — MIDAZOLAM HCL 5 MG/5ML IJ SOLN
INTRAMUSCULAR | Status: AC
  Filled 2013-07-31: qty 10

## 2013-07-31 MED ORDER — ONDANSETRON HCL 4 MG/2ML IJ SOLN
INTRAMUSCULAR | Status: DC | PRN
  Administered 2013-07-31: 4 mg via INTRAVENOUS

## 2013-07-31 MED ORDER — ONDANSETRON HCL 4 MG/2ML IJ SOLN
INTRAMUSCULAR | Status: AC
  Filled 2013-07-31: qty 2

## 2013-07-31 NOTE — Discharge Instructions (Addendum)
Colonoscopy Discharge Instructions  Read the instructions outlined below and refer to this sheet in the next few weeks. These discharge instructions provide you with general information on caring for yourself after you leave the hospital. Your doctor may also give you specific instructions. While your treatment has been planned according to the most current medical practices available, unavoidable complications occasionally occur. If you have any problems or questions after discharge, call Dr. Gala Romney at [phone]. ACTIVITY  You may resume your regular activity, but move at a slower pace for the next 24 hours.   Take frequent rest periods for the next 24 hours.   Walking will help get rid of the air and reduce the bloated feeling in your belly (abdomen).   No driving for 24 hours (because of the medicine (anesthesia) used during the test).    Do not sign any important legal documents or operate any machinery for 24 hours (because of the anesthesia used during the test).  NUTRITION  Drink plenty of fluids.   You may resume your normal diet as instructed by your doctor.   Begin with a light meal and progress to your normal diet. Heavy or fried foods are harder to digest and may make you feel sick to your stomach (nauseated).   Avoid alcoholic beverages for 24 hours or as instructed.  MEDICATIONS  You may resume your normal medications unless your doctor tells you otherwise.  WHAT YOU CAN EXPECT TODAY  Some feelings of bloating in the abdomen.   Passage of more gas than usual.   Spotting of blood in your stool or on the toilet paper.  IF YOU HAD POLYPS REMOVED DURING THE COLONOSCOPY:  No aspirin products for 7 days or as instructed.   No alcohol for 7 days or as instructed.   Eat a soft diet for the next 24 hours.  FINDING OUT THE RESULTS OF YOUR TEST Not all test results are available during your visit. If your test results are not back during the visit, make an appointment  with your caregiver to find out the results. Do not assume everything is normal if you have not heard from your caregiver or the medical facility. It is important for you to follow up on all of your test results.  SEEK IMMEDIATE MEDICAL ATTENTION IF:  You have more than a spotting of blood in your stool.   Your belly is swollen (abdominal distention).   You are nauseated or vomiting.   You have a temperature over 101.  You have abdominal pain or discomfort that is severe or gets worse throughout the day. EGD Discharge instructions Please read the instructions outlined below and refer to this sheet in the next few weeks. These discharge instructions provide you with general information on caring for yourself after you leave the hospital. Your doctor may also give you specific instructions. While your treatment has been planned according to the most current medical practices available, unavoidable complications occasionally occur. If you have any problems or questions after discharge, please call your doctor. ACTIVITY You may resume your regular activity but move at a slower pace for the next 24 hours.  Take frequent rest periods for the next 24 hours.  Walking will help expel (get rid of) the air and reduce the bloated feeling in your abdomen.  No driving for 24 hours (because of the anesthesia (medicine) used during the test).  You may shower.  Do not sign any important legal documents or operate any machinery for 24  hours (because of the anesthesia used during the test).  NUTRITION Drink plenty of fluids.  You may resume your normal diet.  Begin with a light meal and progress to your normal diet.  Avoid alcoholic beverages for 24 hours or as instructed by your caregiver.  MEDICATIONS You may resume your normal medications unless your caregiver tells you otherwise.  WHAT YOU CAN EXPECT TODAY You may experience abdominal discomfort such as a feeling of fullness or gas pains.   FOLLOW-UP Your doctor will discuss the results of your test with you.  SEEK IMMEDIATE MEDICAL ATTENTION IF ANY OF THE FOLLOWING OCCUR: Excessive nausea (feeling sick to your stomach) and/or vomiting.  Severe abdominal pain and distention (swelling).  Trouble swallowing.  Temperature over 101 F (37.8 C).  Rectal bleeding or vomiting of blood.   Hemorrhoid and diverticulosis information provided  Course of Anusol suppositories as directed  Further recommendations to follow pending review of pathology report   Diverticulosis Diverticulosis is a common condition that develops when small pouches (diverticula) form in the wall of the colon. The risk of diverticulosis increases with age. It happens more often in people who eat a low-fiber diet. Most individuals with diverticulosis have no symptoms. Those individuals with symptoms usually experience abdominal pain, constipation, or loose stools (diarrhea). HOME CARE INSTRUCTIONS  Increase the amount of fiber in your diet as directed by your caregiver or dietician. This may reduce symptoms of diverticulosis. Your caregiver may recommend taking a dietary fiber supplement. Drink at least 6 to 8 glasses of water each day to prevent constipation. Try not to strain when you have a bowel movement. Your caregiver may recommend avoiding nuts and seeds to prevent complications, although this is still an uncertain benefit. Only take over-the-counter or prescription medicines for pain, discomfort, or fever as directed by your caregiver. FOODS WITH HIGH FIBER CONTENT INCLUDE: Fruits. Apple, peach, pear, tangerine, raisins, prunes. Vegetables. Brussels sprouts, asparagus, broccoli, cabbage, carrot, cauliflower, romaine lettuce, spinach, summer squash, tomato, winter squash, zucchini. Starchy Vegetables. Baked beans, kidney beans, lima beans, split peas, lentils, potatoes (with skin). Grains. Whole wheat bread, brown rice, bran flake cereal, plain oatmeal,  white rice, shredded wheat, bran muffins. SEEK IMMEDIATE MEDICAL CARE IF:  You develop increasing pain or severe bloating. You have an oral temperature above 102 F (38.9 C), not controlled by medicine. You develop vomiting or bowel movements that are bloody or black. Document Released: 12/05/2003 Document Revised: 06/01/2011 Document Reviewed: 08/07/2009 [facility] Patient Information 2014 [employer], [place]. Hemorrhoids Hemorrhoids are puffy (swollen) veins around the rectum or anus. Hemorrhoids can cause pain, itching, bleeding, or irritation. HOME CARE Eat foods with fiber, such as whole grains, beans, nuts, fruits, and vegetables. Ask your doctor about taking products with added fiber in them (fibersupplements). Drink enough fluid to keep your pee (urine) clear or pale yellow. Exercise often. Go to the bathroom when you have the urge to poop. Do not wait. Avoid straining to poop (bowel movement). Keep the butt area dry and clean. Use wet toilet paper or moist paper towels. Medicated creams and medicine inserted into the anus (anal suppository) may be used or applied as told. Only take medicine as told by your doctor. Take a warm water bath (sitz bath) for 15 20 minutes to ease pain. Do this 3 4 times a day. Place ice packs on the area if it is tender or puffy. Use the ice packs between the warm water baths. Put ice in a plastic bag. Place a towel  between your skin and the bag. Leave the ice on for 15-20 minutes, 03-04 times a day. Do not use a donut-shaped pillow or sit on the toilet for a long time. GET HELP RIGHT AWAY IF:  You have more pain that is not controlled by treatment or medicine. You have bleeding that will not stop. You have trouble or are unable to poop (bowel movement). You have pain or puffiness outside the area of the hemorrhoids. MAKE SURE YOU:  Understand these instructions. Will watch your condition. Will get help right away if you are not doing well or get  worse. Document Released: 12/17/2007 Document Revised: 02/24/2012 Document Reviewed: 01/19/2012 [facility] Patient Information 2014 [place], [place].

## 2013-07-31 NOTE — Interval H&P Note (Signed)
History and Physical Interval Note:  07/31/2013 10:25 AM  [name] [name]  has presented today for surgery, with the diagnosis of ESOPHAGEAL REFLUX, PEPTIC ULCER,  EPIGASTRIC PAIN, ESOPHAGEAL DYSPHAGIA, RECTAL BLEEDING  The various methods of treatment have been discussed with the patient and family. After consideration of risks, benefits and other options for treatment, the patient has consented to  Procedure(s) with comments: COLONOSCOPY (N/A) - 10:30 ESOPHAGOGASTRODUODENOSCOPY (EGD) (N/A) as a surgical intervention .  The patient's history has been reviewed, patient examined, no change in status, stable for surgery.  I have reviewed the patient's chart and labs.  Questions were answered to the patient's satisfaction.     Gerrit Friendsobert M Zaiyah Sottile  No change. No improvement with omeprazole. EGD with possible esophageal dilation and colonoscopy per plan  The risks, benefits, limitations, imponderables and alternatives regarding both EGD and colonoscopy have been reviewed with the patient. Questions have been answered. All parties agreeable.
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
## 2013-07-31 NOTE — Op Note (Signed)
[facility] [address] [place], [id number]   COLONOSCOPY PROCEDURE REPORT  PATIENT: [name] [name], [name] A.  MR#:         [id number] BIRTHDATE: 10-20-1945 , 67  yrs. old GENDER: Female ENDOSCOPIST: R.  Roetta SessionsMichael Cybil Senegal, MD FACP FACG REFERRED BY:  Catalina PizzaZach Hall, M.D. PROCEDURE DATE:  07/31/2013 PROCEDURE:     Ileocolonoscopy with segmental biopsy  INDICATIONS: hematochezia; chronically loose stools  INFORMED CONSENT:  The risks, benefits, alternatives and imponderables including but not limited to bleeding, perforation as well as the possibility of a missed lesion have been reviewed.  The potential for biopsy, lesion removal, etc. have also been discussed.  Questions have been answered.  All parties agreeable. Please see the history and physical in the medical record for more information.  MEDICATIONS: Versed 9 mg IV and Demerol 100 mg IV in divided doses. Zofran 4 mg IV  DESCRIPTION OF PROCEDURE:  After a digital rectal exam was performed, the EG-2990i ([id number]) and EC-3890Li ([id number]) colonoscope was advanced from the anus through the rectum and colon to the area of the cecum, ileocecal valve and appendiceal orifice. The cecum was deeply intubated.  These structures were well-seen and photographed for the record.  From the level of the cecum and ileocecal valve, the scope was slowly and cautiously withdrawn. The mucosal surfaces were carefully surveyed utilizing scope tip deflection to facilitate fold flattening as needed.  The scope was pulled down into the rectum where a thorough examination including retroflexion was performed.    FINDINGS:  Adequate preparation. Prominent internal hemorrhoids; otherwise, normal rectum. Scattered left-sided diverticula; the remainder of the colonic mucosa appeared normal. The distal 5 cm of terminal ileal mucosa also appeared normal.  THERAPEUTIC / DIAGNOSTIC MANEUVERS PERFORMED:  Segmental biopsies of the ascending and  descending segments were taken for histologic study.  COMPLICATIONS: None  CECAL WITHDRAWAL TIME:  12 minutes  IMPRESSION:  Prominent internal hemorrhoids-likely source of hematochezia. Colonic diverticulosis status post segmental biopsy  RECOMMENDATIONS: Followup on pathology. Course of Anusol suppositories. May benefit from hemorrhoidal bleeding. See EGD report.   _______________________________ eSigned:  R. Roetta SessionsMichael Wilkin Lippy, MD FACP [facility] 07/31/2013 11:26 AM   CC:    PATIENT NAME:  [name] [name], [name] A. MR#: [id number]

## 2013-07-31 NOTE — Op Note (Signed)
[facility] [address] [place], [id number]   ENDOSCOPY PROCEDURE REPORT  PATIENT: Velora HecklerSutcliffe, [name] A.  MR#: [id number] BIRTHDATE: [birth year] , 67  yrs. old GENDER: Female ENDOSCOPIST: R.  Roetta SessionsMichael Prachi Oftedahl, MD FACP FACG REFERRED BY:  Catalina PizzaZach Hall, M.D. PROCEDURE DATE:  07/31/2013 PROCEDURE:     EGD with [name] HashimotoMaloney dilation, esophageal and gastric biopsy  INDICATIONS:     epigastric pain; esophageal dysphagia  INFORMED CONSENT:   The risks, benefits, limitations, alternatives and imponderables have been discussed.  The potential for biopsy, esophogeal dilation, etc. have also been reviewed.  Questions have been answered.  All parties agreeable.  Please see the history and physical in the medical record for more information.  MEDICATIONS:     Versed 5 mg IV and Demerol 100 mg IV in divided doses. Zofran 4 mg IV. Xylocaine gel orally  DESCRIPTION OF PROCEDURE:   The EG-2990i ([id number])  endoscope was introduced through the mouth and advanced to the second portion of the duodenum without difficulty or limitations.  The mucosal surfaces were surveyed very carefully during advancement of the scope and upon withdrawal.  Retroflexion view of the proximal stomach and esophagogastric junction was performed.      FINDINGS:   normal-appearing patent tubular esophagus. Stomach empty. Patient multiple antral erosions surrounded by intense patchy erythema. The remainder the stomach appeared normal. No ulcer or infiltrating process. Patent all worse. Normal first and second portion of the duodenum.  THERAPEUTIC / DIAGNOSTIC MANEUVERS PERFORMED:  A 54 French Maloney dilator was passed to full insertion easily. A look back revealed no apparent complication related to this maneuver,  however, therer was a skinny superficial tear through the UES mucosa. Subsequently, biopsies of the distal esophagus were taken for histologic study and biopsies of the antral mucosal also  taken.   COMPLICATIONS:  None  IMPRESSION:   Normal esophagus   -   status post passage of a Maloney dilator.  Query occult cervical esophageal web. Gastric (antral) erosions and rather intense erythema of uncertain significance-status post biopsy  RECOMMENDATIONS:   Followup on pathology. Further recommendations to follow. See colonoscopy report.    _______________________________ R. Roetta SessionsMichael Chelby Salata, MD FACP [facility] eSigned:  R. Roetta SessionsMichael [name] Rufo, MD FACP [facility] 07/31/2013 10:59 AM     CC:

## 2013-08-03 ENCOUNTER — Telehealth: Payer: Self-pay | Admitting: Internal Medicine

## 2013-08-03 ENCOUNTER — Encounter (HOSPITAL_COMMUNITY): Payer: Self-pay | Admitting: Internal Medicine

## 2013-08-03 NOTE — Telephone Encounter (Signed)
Pt had tcs/egd done Monday and was asking about her results. Please call [phone] when available

## 2013-08-06 ENCOUNTER — Encounter: Payer: Self-pay | Admitting: Internal Medicine

## 2013-08-07 NOTE — Telephone Encounter (Signed)
Called and informed pt of results in letters. Pt stated she was having some problems with her bowel movements and she was constipated. Recommended pt try otc miralax prn and increase fluids. Pt stated she would try it and if she doesn't have any results she would call us back for further recommendations.   Darl PikesSusan, per RMR letter, pt needs an ov. Please schedule.

## 2013-08-08 ENCOUNTER — Encounter: Payer: Self-pay | Admitting: Internal Medicine

## 2013-08-08 NOTE — Telephone Encounter (Signed)
Pt is aware of OV on 6/18 at 10 with [facility] and appt card mailed

## 2013-09-07 ENCOUNTER — Encounter (INDEPENDENT_AMBULATORY_CARE_PROVIDER_SITE_OTHER): Payer: Self-pay

## 2013-09-07 ENCOUNTER — Ambulatory Visit (INDEPENDENT_AMBULATORY_CARE_PROVIDER_SITE_OTHER): Payer: Medicare Other | Admitting: Gastroenterology

## 2013-09-07 ENCOUNTER — Encounter: Payer: Self-pay | Admitting: Gastroenterology

## 2013-09-07 VITALS — BP 115/76 | HR 74 | Temp 98.6°F | Ht 60.0 in | Wt 145.4 lb

## 2013-09-07 DIAGNOSIS — R1310 Dysphagia, unspecified: Secondary | ICD-10-CM

## 2013-09-07 DIAGNOSIS — R1319 Other dysphagia: Secondary | ICD-10-CM

## 2013-09-07 DIAGNOSIS — K2970 Gastritis, unspecified, without bleeding: Secondary | ICD-10-CM

## 2013-09-07 DIAGNOSIS — K2990 Gastroduodenitis, unspecified, without bleeding: Secondary | ICD-10-CM

## 2013-09-07 DIAGNOSIS — K589 Irritable bowel syndrome without diarrhea: Secondary | ICD-10-CM

## 2013-09-07 DIAGNOSIS — R1314 Dysphagia, pharyngoesophageal phase: Secondary | ICD-10-CM

## 2013-09-07 NOTE — Patient Instructions (Signed)
1. Continue omeprazole for at least 3 months. This will allow time to heal the inflammation in your stomach. You may try to come off at that time but if you have recurrent abdominal pain, nausea, heartburn you should stay on indefinitely. 2. Increase your fluid consumption, try to drink at least 8 cups of fluid daily. 3. Office visit as needed.

## 2013-09-07 NOTE — Progress Notes (Signed)
      Primary Care Physician: Catalina PizzaHALL, ZACH, MD  Primary Gastroenterologist:  Roetta SessionsMichael Rourk, MD   Chief Complaint  Patient presents with  . Bowels irregular    HPI: [name] [name] is a [age] y.o. female here for f/u recent EGD/TCS. See findings as noted below. Overall she feels much improved. Bowels have settled down and actually has had some constipation. Drinks very little fluids. Nothing after 6pm due to nocturnal urination. Denies rectal bleeding. Abdominal pain resolved. Doing well on omeprazole. No further episodes of fecal incontinence.        . Colonoscopy N/A 07/31/2013    Dr. Jena Gaussourk: prominent internal hemorrhoids, left sided diverticulosis, distal TI normal, random bx negative  . Esophagogastroduodenoscopy N/A 07/31/2013    Dr. Jena Gaussourk: 31F dilation with ?cervical esophageal web, gastric erosion and intense erytehma, bx benign without H.pylori, esopahgeal bx with some inflammation  . Esophageal dilation  07/31/2013    Procedure: ESOPHAGEAL DILATION;  Surgeon: Corbin Adeobert M Rourk, MD;  Location: [facility];  Service: Endoscopy;;     Current Outpatient Prescriptions  Medication Sig Dispense Refill  . amLODipine (NORVASC) 2.5 MG tablet TAKE 1 TABLET BY MOUTH ONCE DAILY  90 tablet  0  . aspirin 81 MG tablet Take 81 mg by mouth daily.      . chlorthalidone (HYGROTON) 25 MG tablet TAKE 1/2 TABLET BY MOUTH EVERY DAY  45 tablet  0  . omeprazole (PRILOSEC) 20 MG capsule Take 1 capsule (20 mg total) by mouth daily.  30 capsule  11  . PRAVACHOL 80 MG tablet TAKE (1) TABLET BY MOUTH ONCE A DAY AT BEDTIME FOR CHOLESTEROL.  90 each  3   No current facility-administered medications for this visit.    Allergies as of 09/07/2013 - Review Complete 09/07/2013  Allergen Reaction Noted  . Aspirin Hives 07/31/2013  . Codeine    . Penicillins  03/02/2006    ROS:  General: Negative for anorexia, weight loss, fever, chills, fatigue, weakness. ENT: Negative for hoarseness, difficulty  swallowing , nasal congestion. CV: Negative for chest pain, angina, palpitations, dyspnea on exertion, peripheral edema.  Respiratory: Negative for dyspnea at rest, dyspnea on exertion, cough, sputum, wheezing.  GI: See history of present illness. GU:  Negative for dysuria, hematuria, urinary incontinence, urinary frequency, nocturnal urination.  Endo: Negative for unusual weight change.    Physical Examination:   BP 115/76  Pulse 74  Temp(Src) 98.6 F (37 C) (Oral)  Ht 5' (1.524 m)  Wt 145 lb 6.4 oz (65.953 kg)  BMI 28.40 kg/m2  General: Well-nourished, well-developed in no acute distress.  Eyes: No icterus. Mouth: Oropharyngeal mucosa moist and pink , no lesions erythema or exudate. Lungs: Clear to auscultation bilaterally.  Heart: Regular rate and rhythm, no murmurs rubs or gallops.  Abdomen: Bowel sounds are normal, nontender, nondistended, no hepatosplenomegaly or masses, no abdominal bruits or hernia , no rebound or guarding.   Extremities: No lower extremity edema. No clubbing or deformities. Neuro: Alert and oriented x 4   Skin: Warm and dry, no jaundice.   Psych: Alert and cooperative, normal mood and affect.

## 2013-09-09 NOTE — Assessment & Plan Note (Signed)
Suspect alternating constipation/diarrhea secondary to IBS or functional disease. Patient doing well at this time. OV prn.

## 2013-09-09 NOTE — Assessment & Plan Note (Signed)
Improved

## 2013-09-09 NOTE — Assessment & Plan Note (Signed)
Abdominal pain resolved on PPI therapy. Continue omeprazole for at least 3 months. Consider taper at that time if tolerated.

## 2013-09-11 NOTE — Progress Notes (Signed)
Cc'd to pcp 

## 2014-04-26 ENCOUNTER — Other Ambulatory Visit (HOSPITAL_COMMUNITY): Payer: Self-pay | Admitting: Radiology

## 2014-04-26 DIAGNOSIS — G4730 Sleep apnea, unspecified: Secondary | ICD-10-CM

## 2014-04-30 ENCOUNTER — Ambulatory Visit: Payer: Medicare Other | Attending: Internal Medicine | Admitting: Sleep Medicine

## 2014-04-30 DIAGNOSIS — G4733 Obstructive sleep apnea (adult) (pediatric): Secondary | ICD-10-CM | POA: Insufficient documentation

## 2014-04-30 DIAGNOSIS — R0681 Apnea, not elsewhere classified: Secondary | ICD-10-CM | POA: Diagnosis present

## 2014-04-30 DIAGNOSIS — G4730 Sleep apnea, unspecified: Secondary | ICD-10-CM

## 2014-05-19 ENCOUNTER — Encounter (HOSPITAL_COMMUNITY): Payer: Self-pay | Admitting: Emergency Medicine

## 2014-05-19 ENCOUNTER — Emergency Department (HOSPITAL_COMMUNITY): Payer: Medicare Other

## 2014-05-19 ENCOUNTER — Observation Stay (HOSPITAL_COMMUNITY)
Admission: EM | Admit: 2014-05-19 | Discharge: 2014-05-20 | Disposition: A | Discharge status: Home / Self Care | Payer: Medicare Other | Attending: Internal Medicine | Admitting: Internal Medicine

## 2014-05-19 ENCOUNTER — Observation Stay (HOSPITAL_COMMUNITY): Payer: Medicare Other

## 2014-05-19 DIAGNOSIS — F419 Anxiety disorder, unspecified: Secondary | ICD-10-CM | POA: Insufficient documentation

## 2014-05-19 DIAGNOSIS — I719 Aortic aneurysm of unspecified site, without rupture: Secondary | ICD-10-CM | POA: Diagnosis not present

## 2014-05-19 DIAGNOSIS — Z7982 Long term (current) use of aspirin: Secondary | ICD-10-CM | POA: Insufficient documentation

## 2014-05-19 DIAGNOSIS — M1990 Unspecified osteoarthritis, unspecified site: Secondary | ICD-10-CM | POA: Insufficient documentation

## 2014-05-19 DIAGNOSIS — K219 Gastro-esophageal reflux disease without esophagitis: Secondary | ICD-10-CM | POA: Diagnosis not present

## 2014-05-19 DIAGNOSIS — N951 Menopausal and female climacteric states: Secondary | ICD-10-CM | POA: Insufficient documentation

## 2014-05-19 DIAGNOSIS — E785 Hyperlipidemia, unspecified: Secondary | ICD-10-CM | POA: Insufficient documentation

## 2014-05-19 DIAGNOSIS — Z880 Allergy status to penicillin: Secondary | ICD-10-CM | POA: Diagnosis not present

## 2014-05-19 DIAGNOSIS — K279 Peptic ulcer, site unspecified, unspecified as acute or chronic, without hemorrhage or perforation: Secondary | ICD-10-CM | POA: Diagnosis not present

## 2014-05-19 DIAGNOSIS — M25561 Pain in right knee: Secondary | ICD-10-CM | POA: Insufficient documentation

## 2014-05-19 DIAGNOSIS — I10 Essential (primary) hypertension: Secondary | ICD-10-CM | POA: Diagnosis not present

## 2014-05-19 DIAGNOSIS — I509 Heart failure, unspecified: Secondary | ICD-10-CM | POA: Insufficient documentation

## 2014-05-19 DIAGNOSIS — R55 Syncope and collapse: Secondary | ICD-10-CM | POA: Diagnosis not present

## 2014-05-19 DIAGNOSIS — Z79899 Other long term (current) drug therapy: Secondary | ICD-10-CM | POA: Insufficient documentation

## 2014-05-19 DIAGNOSIS — R1310 Dysphagia, unspecified: Secondary | ICD-10-CM | POA: Insufficient documentation

## 2014-05-19 DIAGNOSIS — Z87828 Personal history of other (healed) physical injury and trauma: Secondary | ICD-10-CM | POA: Diagnosis not present

## 2014-05-19 DIAGNOSIS — F329 Major depressive disorder, single episode, unspecified: Secondary | ICD-10-CM | POA: Insufficient documentation

## 2014-05-19 DIAGNOSIS — R1319 Other dysphagia: Secondary | ICD-10-CM | POA: Diagnosis present

## 2014-05-19 DIAGNOSIS — K589 Irritable bowel syndrome without diarrhea: Secondary | ICD-10-CM | POA: Insufficient documentation

## 2014-05-19 DIAGNOSIS — R1314 Dysphagia, pharyngoesophageal phase: Secondary | ICD-10-CM

## 2014-05-19 DIAGNOSIS — I2510 Atherosclerotic heart disease of native coronary artery without angina pectoris: Secondary | ICD-10-CM | POA: Insufficient documentation

## 2014-05-19 DIAGNOSIS — R42 Dizziness and giddiness: Secondary | ICD-10-CM

## 2014-05-19 DIAGNOSIS — N3281 Overactive bladder: Secondary | ICD-10-CM | POA: Insufficient documentation

## 2014-05-19 LAB — BASIC METABOLIC PANEL
Anion gap: 4 — ABNORMAL LOW (ref 5–15)
BUN: 19 mg/dL (ref 6–23)
CO2: 26 mmol/L (ref 19–32)
CREATININE: 0.66 mg/dL (ref 0.50–1.10)
Calcium: 8.8 mg/dL (ref 8.4–10.5)
Chloride: 107 mmol/L (ref 96–112)
GFR calc non Af Amer: 89 mL/min — ABNORMAL LOW (ref >90)
Glucose, Bld: 114 mg/dL — ABNORMAL HIGH (ref 70–99)
Potassium: 4.1 mmol/L (ref 3.5–5.1)
SODIUM: 137 mmol/L (ref 135–145)

## 2014-05-19 LAB — URINE MICROSCOPIC-ADD ON

## 2014-05-19 LAB — URINALYSIS, ROUTINE W REFLEX MICROSCOPIC
BILIRUBIN URINE: NEGATIVE
GLUCOSE, UA: NEGATIVE mg/dL
Hgb urine dipstick: NEGATIVE
KETONES UR: NEGATIVE mg/dL
Nitrite: NEGATIVE
Protein, ur: NEGATIVE mg/dL
SPECIFIC GRAVITY, URINE: 1.01 (ref 1.005–1.030)
UROBILINOGEN UA: 0.2 mg/dL (ref 0.0–1.0)
pH: 8 (ref 5.0–8.0)

## 2014-05-19 LAB — CBC
HCT: 41.9 % (ref 36.0–46.0)
Hemoglobin: 13.9 g/dL (ref 12.0–15.0)
MCH: 29.7 pg (ref 26.0–34.0)
MCHC: 33.2 g/dL (ref 30.0–36.0)
MCV: 89.5 fL (ref 78.0–100.0)
Platelets: 213 10*3/uL (ref 150–400)
RBC: 4.68 MIL/uL (ref 3.87–5.11)
RDW: 13.4 % (ref 11.5–15.5)
WBC: 5.8 10*3/uL (ref 4.0–10.5)

## 2014-05-19 LAB — I-STAT TROPONIN, ED
TROPONIN I, POC: 0 ng/mL (ref 0.00–0.08)
Troponin i, poc: 0 ng/mL (ref 0.00–0.08)

## 2014-05-19 LAB — TSH: TSH: 2.164 u[IU]/mL (ref 0.350–4.500)

## 2014-05-19 LAB — POC OCCULT BLOOD, ED: Fecal Occult Bld: NEGATIVE

## 2014-05-19 MED ORDER — SODIUM CHLORIDE 0.9 % IJ SOLN
3.0000 mL | Freq: Two times a day (BID) | INTRAMUSCULAR | Status: DC
  Administered 2014-05-19 (×2): 3 mL via INTRAVENOUS

## 2014-05-19 MED ORDER — HEPARIN SODIUM (PORCINE) 5000 UNIT/ML IJ SOLN
5000.0000 [IU] | Freq: Three times a day (TID) | INTRAMUSCULAR | Status: DC
  Administered 2014-05-19 – 2014-05-20 (×4): 5000 [IU] via SUBCUTANEOUS
  Filled 2014-05-19 (×5): qty 1

## 2014-05-19 MED ORDER — ONDANSETRON HCL 4 MG/2ML IJ SOLN: 4.0000 mg | Freq: Four times a day (QID) | INTRAMUSCULAR | Status: DC | PRN

## 2014-05-19 MED ORDER — ACETAMINOPHEN 325 MG PO TABS: 650.0000 mg | ORAL_TABLET | Freq: Four times a day (QID) | ORAL | Status: DC | PRN

## 2014-05-19 MED ORDER — MECLIZINE HCL 12.5 MG PO TABS
25.0000 mg | ORAL_TABLET | Freq: Three times a day (TID) | ORAL | Status: DC | PRN
  Administered 2014-05-19: 25 mg via ORAL
  Filled 2014-05-19: qty 2

## 2014-05-19 MED ORDER — ONDANSETRON HCL 4 MG PO TABS: 4.0000 mg | ORAL_TABLET | Freq: Four times a day (QID) | ORAL | Status: DC | PRN

## 2014-05-19 MED ORDER — SODIUM CHLORIDE 0.9 % IV SOLN
INTRAVENOUS | Status: DC
  Administered 2014-05-19: 11:00:00 via INTRAVENOUS

## 2014-05-19 MED ORDER — SENNOSIDES-DOCUSATE SODIUM 8.6-50 MG PO TABS: 1.0000 | ORAL_TABLET | Freq: Every evening | ORAL | Status: DC | PRN

## 2014-05-19 MED ORDER — LORAZEPAM 0.5 MG PO TABS
0.5000 mg | ORAL_TABLET | Freq: Once | ORAL | Status: AC
  Administered 2014-05-19: 0.5 mg via ORAL
  Filled 2014-05-19: qty 1

## 2014-05-19 MED ORDER — ACETAMINOPHEN 650 MG RE SUPP: 650.0000 mg | Freq: Four times a day (QID) | RECTAL | Status: DC | PRN
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
## 2014-05-19 NOTE — ED Notes (Signed)
Patient c/o constant dizziness that started last week. Per patient had syncopal episode last week while standing in front of refrigerator, unsure of how long LOC lasted. Patient reports "waking up on floor with refrigerator door open." Denies being seen after LOC. Denies any headache or blurred vision.
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
## 2014-05-19 NOTE — Progress Notes (Signed)
PT Cancellation Note  Patient Details Name: [name] [name] MRN: [id number] DOB: 09/30/1945   Cancelled Treatment:    Reason Eval/Treat Not Completed: Other (comment) (Patient and husband requested that patient be seen by Speech pathologist first. Patient has not had anything to eat since 5:30pm  as od 05/18/2014.) Patient was seen in a semifolwer's position [facility](HOH elevated >45 degrees and lesser than 90 degrees)  in bed with IV line on the right antecubital area. Patient awake, alert, oriented, cooperative, pleasant and able to follow directions well. PT initiated Dix-Hallpike maneuver but not completed due to patient apprehension and husband request to have the Speech Therapist address the feeding first. PT evaluation initiated but not completed at this time due to no food intake since 5:30pm as of 05/18/2014 and due to patient/family request. Evaluation to be completed on the later date.   Sutton Hirsch A 05/19/2014, 2:31 PM

## 2014-05-19 NOTE — ED Notes (Signed)
hospitalist at bedside

## 2014-05-20 DIAGNOSIS — R1314 Dysphagia, pharyngoesophageal phase: Secondary | ICD-10-CM | POA: Diagnosis not present

## 2014-05-20 DIAGNOSIS — E785 Hyperlipidemia, unspecified: Secondary | ICD-10-CM | POA: Diagnosis not present

## 2014-05-20 DIAGNOSIS — I10 Essential (primary) hypertension: Secondary | ICD-10-CM | POA: Diagnosis not present

## 2014-05-20 DIAGNOSIS — R55 Syncope and collapse: Secondary | ICD-10-CM | POA: Diagnosis not present

## 2014-05-20 LAB — CBC
HCT: 38.2 % (ref 36.0–46.0)
Hemoglobin: 12.4 g/dL (ref 12.0–15.0)
MCH: 29 pg (ref 26.0–34.0)
MCHC: 32.5 g/dL (ref 30.0–36.0)
MCV: 89.5 fL (ref 78.0–100.0)
Platelets: 204 10*3/uL (ref 150–400)
RBC: 4.27 MIL/uL (ref 3.87–5.11)
RDW: 13.4 % (ref 11.5–15.5)
WBC: 5.4 10*3/uL (ref 4.0–10.5)

## 2014-05-20 LAB — BASIC METABOLIC PANEL
ANION GAP: 5 (ref 5–15)
BUN: 11 mg/dL (ref 6–23)
CHLORIDE: 112 mmol/L (ref 96–112)
CO2: 26 mmol/L (ref 19–32)
CREATININE: 0.68 mg/dL (ref 0.50–1.10)
Calcium: 8.7 mg/dL (ref 8.4–10.5)
GFR calc Af Amer: >90 mL/min (ref >90)
GFR calc non Af Amer: 89 mL/min — ABNORMAL LOW (ref >90)
Glucose, Bld: 95 mg/dL (ref 70–99)
Potassium: 3.9 mmol/L (ref 3.5–5.1)
Sodium: 143 mmol/L (ref 135–145)

## 2014-05-20 MED ORDER — MECLIZINE HCL 25 MG PO TABS: 25.0000 mg | ORAL_TABLET | Freq: Three times a day (TID) | ORAL | Status: DC | PRN

## 2014-05-20 NOTE — Progress Notes (Signed)
Patient states understanding of discharge instructions, prescriptions given 

## 2014-05-20 NOTE — Progress Notes (Signed)
UR completed 

## 2014-05-20 NOTE — Progress Notes (Signed)
  Echocardiogram 2D Echocardiogram has been performed.  Stacey DrainWhite, Dannielle Baskins J 05/20/2014, 11:38 AM

## 2014-05-20 NOTE — Discharge Summary (Signed)
Physician Discharge Summary  [name] [name] NWG:[id number] DOB: April 15, 1945 DOA: 05/19/2014  PCP: Catalina PizzaHALL, ZACH, MD  Admit date: 05/19/2014 Discharge date: 05/20/2014  Time spent: 45 minutes  Recommendations for Outpatient Follow-up:  -Will be discharged home today. -Advised to follow up with PCP in 2 weeks. -HHPT will be arranged for vestibular training.   Discharge Diagnoses:  Principal Problem:   Syncope Active Problems:   Vertigo   Hyperlipidemia   Peptic ulcer   Hypertension   Esophageal dysphagia   CAD (coronary artery disease)   Discharge Condition: Stable and improved  Filed Weights   05/19/14 0705 05/19/14 1028  Weight: 69.4 kg (153 lb) 68.5 kg (151 lb 0.2 oz)    History of present illness:  Patient is a 69 year old woman with past medical history significant for coronary artery disease status post cardiac cath in 2010 during which she was found to have single-vessel disease with ischemia demonstrated on stress testing which is caused by a vessel of small size not amenable to intervention and was recommended medical management. Also history of hypertension, hyperlipidemia and GERD with peptic ulcer disease. She states that about a week ago she went to the refrigerator to get a bottle of water to take her medications before bedtime and next remembers waking up on the floor. Prior to this she had been doing some heavy [place] and admits to not eating or drinking anything that day and very minimally the day prior. More recently, she describes she has had 2 distinct episodes over the past 3 days where she feels like the room is spinning around her and has actually had to grab onto the bed for feeling of falling. When this happens she feels like she walks sideways towards the right and her husband has noticed her "eyes clicking". We have been asked to admit her for further evaluation and management. 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 She also complained of difficulty swallowing and failed an RN driven bedside swallow evaluation.  Hospital Course:   Syncopal Episode -All work up has been negative including: CT Head, ECHO/dopplers, has ruled out for ACS, no arrhthymias on tele, negative orthostatics. -See no need for MRI given lack of focal neurologic findings.  BPPV -Meclizine PRN. -HHPT for vestibular training.  CAD -Stable; no CP.  HTN -Well controlled. -Continue home medications on DC.  Procedures: Carotid Dopplers:IMPRESSION: Normal bilateral carotid duplex ultrasound without evidence of significant atherosclerotic plaque or stenosis.    ECHO:  Left ventricle: The cavity size was normal. Systolic function was normal. The estimated ejection fraction was in the range of 55% to 60%. Wall motion was normal; there were no regional wall motion abnormalities. Doppler parameters are consistent with abnormal left ventricular relaxation (grade 1 diastolic dysfunction).  Consultations:  None  Discharge Instructions  Discharge Instructions    Diet - low sodium heart healthy    Complete by:  As directed      Increase activity slowly    Complete by:  As directed             Medication List    STOP taking these medications        chlorthalidone 25 MG tablet  Commonly known as:  HYGROTON  omeprazole 20 MG capsule  Commonly known as:  PRILOSEC      TAKE these medications        amLODipine 2.5 MG tablet  Commonly known as:  NORVASC  TAKE 1 TABLET BY MOUTH ONCE DAILY     aspirin 81 MG tablet  Take 81 mg by mouth daily.     hydrochlorothiazide 25 MG tablet  Commonly known as:  HYDRODIURIL  Take 25 tablets by mouth daily.     meclizine 25 MG tablet    Commonly known as:  ANTIVERT  Take 1 tablet (25 mg total) by mouth 3 (three) times daily as needed for dizziness.     Melatonin 10 MG Caps  Take 1 capsule by mouth at bedtime.     PRAVACHOL 80 MG tablet  Generic drug:  pravastatin  TAKE (1) TABLET BY MOUTH ONCE A DAY AT BEDTIME FOR CHOLESTEROL.       Allergies  Allergen Reactions  . Aspirin Hives  . Codeine     REACTION: Hallucinations  . Penicillins     REACTION: Rash       Follow-up Information    Follow up with Catalina Pizza, MD. Schedule an appointment as soon as possible for a visit in 2 weeks.   Specialty:  Internal Medicine   Contact informationAlison Murray [id number]        The results of significant diagnostics from this hospitalization (including imaging, microbiology, ancillary and laboratory) are listed below for reference.    Significant Diagnostic Studies: Ct Head Wo Contrast  05/19/2014   CLINICAL DATA:  Vertigo for 1 week.  Syncope with Fall last week.  EXAM: CT HEAD WITHOUT CONTRAST  TECHNIQUE: Contiguous axial images were obtained from the base of the skull through the vertex without intravenous contrast.  COMPARISON:  Multiple exams, including 07/25/2008  FINDINGS: The brainstem, cerebellum, cerebral peduncles, thalamus, basal ganglia, basilar cisterns, and ventricular system appear within normal limits. No intracranial hemorrhage, mass lesion, or acute CVA.  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  IMPRESSION: 1. 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 Otherwise, no significant abnormalities are observed.   Electronically Signed   By: Gaylyn Rong M.D.   On: 05/19/2014 08:29   US Carotid Bilateral  05/19/2014   CLINICAL DATA:  69 year old female with syncope 1 week previously and vertigo  EXAM: BILATERAL CAROTID DUPLEX ULTRASOUND  TECHNIQUE: Wallace Cullens scale imaging, color Doppler and duplex ultrasound were performed of bilateral  carotid and vertebral arteries in the neck.  COMPARISON:  CT head 05/19/2014  FINDINGS: Criteria: Quantification of carotid stenosis is based on velocity parameters that correlate the residual internal carotid diameter with NASCET-based stenosis levels, using the diameter of the distal internal carotid lumen as the denominator for stenosis measurement.  The following velocity measurements were obtained:  RIGHT  ICA:  95/26 cm/sec  CCA:  63/13 cm/sec  SYSTOLIC ICA/CCA RATIO:  1.5  DIASTOLIC ICA/CCA RATIO:  2.8  ECA:  84 cm/sec  LEFT  ICA:  96/26 cm/sec  CCA:  73/17 cm/sec  SYSTOLIC ICA/CCA RATIO:  1.3  DIASTOLIC ICA/CCA RATIO:  1.6  ECA:  87 cm/sec  RIGHT CAROTID ARTERY: No significant atherosclerotic plaque or evidence of stenosis.  RIGHT VERTEBRAL ARTERY:  Patent with normal antegrade flow.  LEFT CAROTID ARTERY: No significant atherosclerotic plaque or evidence of stenosis.  LEFT VERTEBRAL ARTERY:  Patent with normal antegrade flow.  IMPRESSION: Normal bilateral carotid duplex ultrasound without  evidence of significant atherosclerotic plaque or stenosis.  Signed,  Sterling Big, MD  [facility]  [facility]   Electronically Signed   By: [name] M.D.   On: 05/19/2014 14:45    Microbiology: No results found for this or any previous visit (from the past 240 hour(s)).   Labs: Basic Metabolic Panel:  Recent Labs Lab 05/19/14 0720 05/20/14 0616  NA 137 143  K 4.1 3.9  CL 107 112  CO2 26 26  GLUCOSE 114* 95  BUN 19 11  CREATININE 0.66 0.68  CALCIUM 8.8 8.7   Liver Function Tests: No results for input(s): AST, ALT, ALKPHOS, BILITOT, PROT, ALBUMIN in the last 168 hours. No results for input(s): LIPASE, AMYLASE in the last 168 hours. No results for input(s): AMMONIA in the last 168 hours. CBC:  Recent Labs Lab 05/19/14 0720 05/20/14 0616  WBC 5.8 5.4  HGB 13.9 12.4  HCT 41.9 38.2  MCV 89.5 89.5  PLT 213 204   Cardiac  Enzymes: No results for input(s): CKTOTAL, CKMB, CKMBINDEX, TROPONINI in the last 168 hours. BNP: BNP (last 3 results) No results for input(s): BNP in the last 8760 hours.  ProBNP (last 3 results) No results for input(s): PROBNP in the last 8760 hours.  CBG: No results for input(s): GLUCAP in the last 168 hours.     SignedChaya Jan  [facility] Pager: [phone] 05/20/2014, 5:04 PM

## 2014-05-20 NOTE — Evaluation (Signed)
Physical Therapy Evaluation Patient Details Name: [name] [name] MRN: [id number] DOB: 05-14-1945 Today's Date: 05/20/2014   History of Present Illness  Patient is a 69 year old woman with past medical history significant for coronary artery disease status post cardiac cath in 2010 during which she was found to have single-vessel disease with ischemia demonstrated on stress testing which is caused by a vessel of small size not amenable to intervention and was recommended medical management. Also history of hypertension, hyperlipidemia and GERD with peptic ulcer disease. She states that about a week ago she went to the refrigerator to get a bottle of water to take her medications before bedtime and next remembers waking up on the floor. Prior to this she had been doing some heavy working with her husband at their [place] trying to get it ready and admits to not eating or drinking anything that day and very minimally the day prior. More recently, she describes she has had 2 distinct episodes over the past 3 days where she feels like the room is spinning around her and has actually had to grab onto the bed for feeling of falling. When this happens she feels like she walks sideways towards the right and her husband has noticed her "eyes clicking". We have been asked to admit her for further evaluation and management. 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 She also complained of difficulty swallowing and failed an RN driven bedside swallow evaluation.   Patient recounted that she has experience intermittent ringing on the left ear for 50 years for split second duration which comes and goes away. Denies any head/neck/ear trauma. Denies any ear infections. Patient report hearing deficit: no hearing aid due  to financial constraint.   Patient and husband recalled that current chief complaint started on 05/03/2014 after working the whole day doing some heavy working with her husband at their [place] during that night. When patient was about to lay down to bed to sleep, She brought her head backward to lay down when she felt sudden feeling of spinning. Husband heard wife cried for help. Patient was crying and told husband that the world/environment is spinning around. Condition worsen until  05/19/2014 when she  more recently, she describes she has had 2 distinct episodes over the past 3 days where she feels like the room is spinning around her and has actually had to grab onto the bed for feeling of falling. When this happens she feels like she walks sideways towards the right and her husband has noticed her "eyes clicking".     Clinical Impression  [name] [name], a 69 year old right handed female, seen today for PT evaluation and treatment complaints of sense of spinning causing her to lose balance and instability. Patient recounted that she has experience intermittent ringing on the left ear for 50 years for split second duration which comes and goes away. Denies any head/neck/ear trauma. Denies any ear infections. Patient report hearing deficit: no hearing aid due to financial constraint.   Patient and husband recalled that current chief complaint started on 05/03/2014 after working the whole day doing some heavy working with her husband at their [place] during that night. When patient was about to lay down to bed to sleep, She brought her head backward to lay down when she felt sudden  feeling of spinning that lasts for seconds. Condition worsen until  05/19/2014 when she  more recently, she describes she has had 2 distinct episodes over the past 3 days where she feels like the room is spinning around her and has actually had to grab onto the bed for feeling of falling. When this happens she feels  like she walks sideways towards the right and her husband has noticed her "eyes clicking". Current functional status: Independent in bed mobility, transfers and Gait. ROM and MMT is WNL/WFL. Sitting balance is Good/Fair + with eyes open. Problem list includes balance and mobility problem secondary to Left Benign paroxysmal positional vertigo (BPPV) .Dix-Hallpike Maneuver performed: (+) Nystagmus. Symptoms elicited on the Left side. (+) tapping sensation on the mastoid process. Symptoms is induced by movement and increased with looking up/looking to the ceiling, sudden movement looking to the left, Standing very fast from Sit to stand. Symptoms not relieved by closed eyes. Epley's Maneuver performed: Symptoms is relieved by bring head and neck to neutral position, getting out of the bed from the right side. Tolerated all therapeutic activities well with no complaints of increase vertigo, LOB or signs of intolerance. No gait deviation and difficulties noted. Recommendation: Soft Cervical Collar for proprioceptive feedback to avoid capitocervical/ Head-Neck movements that aggravates and elicit vertigo used mostly during the day especially when doing ADL; HHPT for Vestibular Rehabilitation and Habituation exercises to address vertigo per patient/family preference. Patient/family education completed with emphasis on current functional mobility, safely getting in and out of the bed and movement precautions.  Patient was left in the room with husband in sitting position. Nursing staff was notified on current functional mobility and status.     Follow Up Recommendations Home health PT    Equipment Recommendations   (Soft Cervical Collar)    Recommendations for Other Services none    Precautions / Restrictions Precautions Precautions: Fall Restrictions Weight Bearing Restrictions: No Other Position/Activity Restrictions: No sudden head and neck movement, No head and neck bending down (capitocervical flexion),  No movement of head and neck backward and down (Capitocervical hyperextension and extension)      Mobility  Bed Mobility Overal bed mobility: Independent   Transfers Overall transfer level: Independent   Ambulation/Gait Ambulation/Gait assistance: Independent Ambulation Distance (Feet): 200 Feet Assistive device: Rolling walker (2 wheeled) (just for safety. Patient [name] [name] walk without walker) Gait Pattern/deviations: WFL(Within Functional Limits)   Gait velocity interpretation: at or above normal speed for age/gender       Balance Overall balance assessment: Independent       Pertinent Vitals/Pain Pain Assessment: No/denies pain    Home Living Family/patient expects to be discharged to:: Private residence Living Arrangements: Spouse/significant other Available Help at Discharge: Available 24 hours/day;Family (husband) Type of Home: House Home Access: Other (comment) Entrance Stairs-Rails: Right Entrance Stairs-Number of Steps: Going back door 3 steps Home Layout: One level Home Equipment: Tub bench;Walker - 2 wheels Additional Comments: walk-in showers    Prior Function Level of Independence: Independent       Hand Dominance   Dominant Hand: Right    Extremity/Trunk Assessment   Upper Extremity Assessment: Overall WFL for tasks assessed  Lower Extremity Assessment: Overall WFL for tasks assessed   Cervical / Trunk Assessment: Normal    Communication   Communication: No difficulties  Cognition Arousal/Alertness: Awake/alert Behavior During Therapy: WFL for tasks assessed/performed Overall Cognitive Status: Within Functional Limits for tasks assessed        Exercises  Assessment/Plan    PT Assessment All further PT needs can be met in the next venue of care  PT Diagnosis Other (comment) (Mobility and Balance problem secondary to positional vertigo, Left)   PT Problem List Decreased mobility;Decreased balance (Mobility and Balance problem  secondary to positional vertigo, Left)  PT Treatment Interventions     PT Goals (Current goals can be found in the Care Plan section) Acute Rehab PT Goals PT Goal Formulation: All assessment and education complete, DC therapy     End of Session Equipment Utilized During Treatment: Gait belt Activity Tolerance: Patient tolerated treatment well Patient left: in bed;with call bell/phone within reach;with family/visitor present (husband) Nurse Communication: Mobility status         Time: 4944-9675 PT Time Calculation (min) (ACUTE ONLY): 60 min   Charges:   PT Treatments $Gait Training: 8-22 mins        [name] [name] A 05/20/2014, 10:10 AM

## 2014-05-26 NOTE — Sleep Study (Signed)
  [facility] Masaki Rothbauer A. Gerilyn Pilgrimoonquah, MD     [email or web address]        NOCTURNAL POLYSOMNOGRAM    LOCATION: SLEEP LAB FACILITY: [facility]   PHYSICIAN: Shakesha Soltau A. Gerilyn Pilgrimoonquah, M.D.   DATE OF STUDY: 04/30/2014.   REFERRING PHYSICIAN: Catalina PizzaZach Hall.   INDICATIONS: The patient is a 69 year old presents with snoring, witnessed apnea and fatigue.  MEDICATIONS:  Prior to Admission medications   Medication Sig Start Date End Date Taking? Authorizing Provider  amLODipine (NORVASC) 2.5 MG tablet TAKE 1 TABLET BY MOUTH ONCE DAILY 05/18/12   Jodelle GrossKathryn M Lawrence, NP  aspirin 81 MG tablet Take 81 mg by mouth daily.    Historical Provider, MD  hydrochlorothiazide (HYDRODIURIL) 25 MG tablet Take 25 tablets by mouth daily. 04/24/14   Historical Provider, MD  meclizine (ANTIVERT) 25 MG tablet Take 1 tablet (25 mg total) by mouth 3 (three) times daily as needed for dizziness. 05/20/14   Henderson CloudEstela Y Hernandez Acosta, MD  Melatonin 10 MG CAPS Take 1 capsule by mouth at bedtime.    Historical Provider, MD  PRAVACHOL 80 MG tablet TAKE (1) TABLET BY MOUTH ONCE A DAY AT BEDTIME FOR CHOLESTEROL. 11/20/11   Kathlen Brunswickobert M Rothbart, MD      EPWORTH SLEEPINESS SCALE: 1.   BMI: 30.   ARCHITECTURAL SUMMARY: This is a split-night recording with the initial portion been a diagnostic and the second portion a titration recording.Total recording time was 400 minutes. Sleep efficiency 81 %. Sleep latency 11 minutes. REM latency 99 minutes. Stage NI 5 %, N2 39 % and N3 36 % and REM sleep 20 %.    RESPIRATORY DATA:  Baseline oxygen saturation is 95 %. The lowest saturation is 66 %. The diagnostic AHI is 50. The patient was placed on positive pressures between 5 and 8. Optimal pressure is 8. The patient tolerated positive pressure very well.   LIMB MOVEMENT SUMMARY: PLM index 11.   ELECTROCARDIOGRAM SUMMARY: Average heart rate is 73 with no significant dysrhythmias observed.   IMPRESSION:  1. Severe obstructive sleep apnea  syndrome which responds well to his CPAP of 8.  Thanks for this referral.  Sharde Gover A. Gerilyn Pilgrimoonquah, M.D. Diplomat, Biomedical engineerAmerican Board of Sleep Medicine.

## 2014-06-07 ENCOUNTER — Other Ambulatory Visit: Payer: Self-pay | Admitting: Internal Medicine

## 2014-06-07 ENCOUNTER — Other Ambulatory Visit: Payer: Self-pay

## 2014-06-07 DIAGNOSIS — N644 Mastodynia: Secondary | ICD-10-CM

## 2014-07-24 ENCOUNTER — Other Ambulatory Visit: Payer: Self-pay | Admitting: Internal Medicine

## 2014-07-24 DIAGNOSIS — N644 Mastodynia: Secondary | ICD-10-CM

## 2014-07-26 ENCOUNTER — Ambulatory Visit
Admission: RE | Admit: 2014-07-26 | Discharge: 2014-07-26 | Disposition: A | Discharge status: Home / Self Care | Payer: Medicare Other | Source: Ambulatory Visit | Attending: Internal Medicine | Admitting: Internal Medicine

## 2014-07-26 DIAGNOSIS — N644 Mastodynia: Secondary | ICD-10-CM

## 2015-03-20 ENCOUNTER — Encounter: Payer: Self-pay | Admitting: Gastroenterology

## 2015-03-20 ENCOUNTER — Ambulatory Visit (INDEPENDENT_AMBULATORY_CARE_PROVIDER_SITE_OTHER): Payer: Medicare Other | Admitting: Gastroenterology

## 2015-03-20 VITALS — BP 123/66 | HR 65 | Temp 98.1°F | Ht 60.0 in | Wt 158.0 lb

## 2015-03-20 DIAGNOSIS — K219 Gastro-esophageal reflux disease without esophagitis: Secondary | ICD-10-CM | POA: Diagnosis not present

## 2015-03-20 DIAGNOSIS — K921 Melena: Secondary | ICD-10-CM | POA: Insufficient documentation

## 2015-03-20 DIAGNOSIS — R1319 Other dysphagia: Secondary | ICD-10-CM

## 2015-03-20 DIAGNOSIS — R6881 Early satiety: Secondary | ICD-10-CM | POA: Diagnosis not present

## 2015-03-20 DIAGNOSIS — R1314 Dysphagia, pharyngoesophageal phase: Secondary | ICD-10-CM

## 2015-03-20 DIAGNOSIS — R1013 Epigastric pain: Secondary | ICD-10-CM

## 2015-03-20 DIAGNOSIS — R1310 Dysphagia, unspecified: Secondary | ICD-10-CM

## 2015-03-20 MED ORDER — OMEPRAZOLE 20 MG PO CPDR: 20.0000 mg | DELAYED_RELEASE_CAPSULE | Freq: Every day | ORAL | Status: DC

## 2015-03-20 NOTE — Assessment & Plan Note (Signed)
69 year old female who presents with two-month history of reported melena. 2 episodes of Pepto-Bismol use in the past 3 weeks. Reports melena on a daily basis. Symptoms associated with early satiety, upper abdominal pain/bloating worsened at the setting of stress. Complains of ongoing esophageal dysphagia, limited relief status post previous dilation. Some typical heartburn symptoms as well. Currently not on PPI. Takes Nexium on occasion.  Clinically she does not appear to have significant anemia. Doubt we are dealing with real element. Nonetheless she is having some upper GI symptoms. We discussed possible upper endoscopy that she is not interested quite yet. We will check CBC, start PPI therapy. In the interim if her symptoms worsen, she develops dizziness/lightheadedness/obvious bleeding she will go to the emergency department. Further recommendations to follow.

## 2015-03-20 NOTE — Progress Notes (Signed)
cc'ed to pcp °
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
## 2015-03-20 NOTE — Patient Instructions (Addendum)
1. Add omeprazole once daily before breakfast. [facility]. 2. Please have your labs done TODAY. If you hemoglobin is low, we will recommend an upper endoscopy.

## 2015-03-21 LAB — CBC WITH DIFFERENTIAL/PLATELET
Basophils Absolute: 0 10*3/uL (ref 0.0–0.1)
Basophils Relative: 0 % (ref 0–1)
Eosinophils Absolute: 0.1 10*3/uL (ref 0.0–0.7)
Eosinophils Relative: 2 % (ref 0–5)
HCT: 41.2 % (ref 36.0–46.0)
HEMOGLOBIN: 13.6 g/dL (ref 12.0–15.0)
Lymphocytes Relative: 25 % (ref 12–46)
Lymphs Abs: 1.8 10*3/uL (ref 0.7–4.0)
MCH: 29.5 pg (ref 26.0–34.0)
MCHC: 33 g/dL (ref 30.0–36.0)
MCV: 89.4 fL (ref 78.0–100.0)
MONO ABS: 0.4 10*3/uL (ref 0.1–1.0)
MPV: 9.2 fL (ref 8.6–12.4)
Monocytes Relative: 6 % (ref 3–12)
NEUTROS PCT: 67 % (ref 43–77)
Neutro Abs: 4.9 10*3/uL (ref 1.7–7.7)
PLATELETS: 238 10*3/uL (ref 150–400)
RBC: 4.61 MIL/uL (ref 3.87–5.11)
RDW: 13.4 % (ref 11.5–15.5)
WBC: 7.3 10*3/uL (ref 4.0–10.5)

## 2015-03-27 ENCOUNTER — Telehealth: Payer: Self-pay | Admitting: Internal Medicine

## 2015-03-27 NOTE — Telephone Encounter (Signed)
Routing to LSL 

## 2015-03-27 NOTE — Telephone Encounter (Signed)
Spoke with the pt.  

## 2015-03-27 NOTE — Progress Notes (Signed)
Quick Note:  Please let patient know her Hgb is NORMAL. Suspect black stools not true MELENA (old blood). Recommend continue PPI. Return to the office in four weeks. If no better at that time, consider further work up. Call sooner if needed. ______

## 2015-03-27 NOTE — Telephone Encounter (Signed)
See result note.  

## 2015-03-27 NOTE — Telephone Encounter (Signed)
[phone]  PLEASE CALL PATIENT IF HER LABS ARE READ

## 2015-04-24 ENCOUNTER — Ambulatory Visit (INDEPENDENT_AMBULATORY_CARE_PROVIDER_SITE_OTHER): Payer: Medicare Other | Admitting: Gastroenterology

## 2015-04-24 ENCOUNTER — Encounter: Payer: Self-pay | Admitting: Gastroenterology

## 2015-04-24 VITALS — BP 134/72 | HR 64 | Temp 97.2°F | Ht 60.0 in | Wt 160.8 lb

## 2015-04-24 DIAGNOSIS — R140 Abdominal distension (gaseous): Secondary | ICD-10-CM

## 2015-04-24 DIAGNOSIS — R1310 Dysphagia, unspecified: Secondary | ICD-10-CM

## 2015-04-24 DIAGNOSIS — K219 Gastro-esophageal reflux disease without esophagitis: Secondary | ICD-10-CM

## 2015-04-24 DIAGNOSIS — R1010 Upper abdominal pain, unspecified: Secondary | ICD-10-CM

## 2015-04-24 DIAGNOSIS — K580 Irritable bowel syndrome with diarrhea: Secondary | ICD-10-CM

## 2015-04-24 LAB — CBC WITH DIFFERENTIAL/PLATELET
Basophils Absolute: 0 10*3/uL (ref 0.0–0.1)
Basophils Relative: 0 % (ref 0–1)
EOS ABS: 0.1 10*3/uL (ref 0.0–0.7)
EOS PCT: 2 % (ref 0–5)
HCT: 40.1 % (ref 36.0–46.0)
Hemoglobin: 13.1 g/dL (ref 12.0–15.0)
Lymphocytes Relative: 22 % (ref 12–46)
Lymphs Abs: 1.5 10*3/uL (ref 0.7–4.0)
MCH: 29.2 pg (ref 26.0–34.0)
MCHC: 32.7 g/dL (ref 30.0–36.0)
MCV: 89.5 fL (ref 78.0–100.0)
MPV: 8.9 fL (ref 8.6–12.4)
Monocytes Absolute: 0.5 10*3/uL (ref 0.1–1.0)
Monocytes Relative: 7 % (ref 3–12)
Neutro Abs: 4.6 10*3/uL (ref 1.7–7.7)
Neutrophils Relative %: 69 % (ref 43–77)
PLATELETS: 245 10*3/uL (ref 150–400)
RBC: 4.48 MIL/uL (ref 3.87–5.11)
RDW: 13.4 % (ref 11.5–15.5)
WBC: 6.6 10*3/uL (ref 4.0–10.5)

## 2015-04-24 LAB — COMPREHENSIVE METABOLIC PANEL
ALBUMIN: 4 g/dL (ref 3.6–5.1)
ALK PHOS: 79 U/L (ref 33–130)
ALT: 22 U/L (ref 6–29)
AST: 17 U/L (ref 10–35)
BUN: 17 mg/dL (ref 7–25)
CALCIUM: 9 mg/dL (ref 8.6–10.4)
CO2: 27 mmol/L (ref 20–31)
Chloride: 105 mmol/L (ref 98–110)
Creat: 0.75 mg/dL (ref 0.50–0.99)
GLUCOSE: 96 mg/dL (ref 65–99)
POTASSIUM: 4 mmol/L (ref 3.5–5.3)
Sodium: 140 mmol/L (ref 135–146)
Total Bilirubin: 0.4 mg/dL (ref 0.2–1.2)
Total Protein: 6.8 g/dL (ref 6.1–8.1)

## 2015-04-24 LAB — TSH: TSH: 1.388 u[IU]/mL (ref 0.350–4.500)

## 2015-04-24 NOTE — Assessment & Plan Note (Addendum)
Continues to have upper abdominal discomfort associated with bloating sometimes postprandially. Sometimes worse with stress. Also with excessive gas/indigestion/flatulence, frequent stools. Suspect underlying functional disease/IBS. Need to exclude celiac disease. Evaluate gallbladder. I have requested that she use lactose-free milk and avoid dairy as much possible if she has to use Lactaid when she does consume dairy to see if this helps with her gas and bloating. She'll continue omeprazole 20 mg daily for now. Align one daily for 4 weeks. Progress report in couple weeks.
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
## 2015-04-24 NOTE — Progress Notes (Signed)
CC'D TO PCP °

## 2015-04-24 NOTE — Assessment & Plan Note (Signed)
Next step would be barium pill esophagram. Patient wants to postpone for now.

## 2015-04-24 NOTE — Patient Instructions (Signed)
1. Please have your labs done.  2. Please have your abdominal ultrasound done. 3. Continue omeprazole daily. 4. Align once daily for four weeks. 5. Call in two weeks with progress report.

## 2015-04-25 LAB — TISSUE TRANSGLUTAMINASE, IGA: Tissue Transglutaminase Ab, IgA: 1 U/mL (ref <4)

## 2015-04-25 LAB — IGA: IGA: 410 mg/dL — AB (ref 69–380)

## 2015-04-30 ENCOUNTER — Ambulatory Visit (HOSPITAL_COMMUNITY)
Admission: RE | Admit: 2015-04-30 | Discharge: 2015-04-30 | Disposition: A | Discharge status: Home / Self Care | Payer: Medicare Other | Source: Ambulatory Visit | Attending: Gastroenterology | Admitting: Gastroenterology

## 2015-04-30 DIAGNOSIS — R1310 Dysphagia, unspecified: Secondary | ICD-10-CM | POA: Diagnosis not present

## 2015-04-30 DIAGNOSIS — K580 Irritable bowel syndrome with diarrhea: Secondary | ICD-10-CM | POA: Diagnosis not present

## 2015-04-30 DIAGNOSIS — R140 Abdominal distension (gaseous): Secondary | ICD-10-CM | POA: Diagnosis not present

## 2015-04-30 DIAGNOSIS — K219 Gastro-esophageal reflux disease without esophagitis: Secondary | ICD-10-CM | POA: Diagnosis not present

## 2015-04-30 DIAGNOSIS — R1010 Upper abdominal pain, unspecified: Secondary | ICD-10-CM | POA: Diagnosis not present

## 2015-05-02 NOTE — Progress Notes (Signed)
Quick Note:  gb looks ok, no stones or inflammation. Cannot rule out gallbladder functioning poorly. We initiated some changes at time of her OV. If she has persistent symptoms despite these changes, I would recommend HIDA with [facility] to complete GB work up. ______

## 2015-05-02 NOTE — Progress Notes (Signed)
Quick Note:  Please let patient know her labs look good. No celiac disease, thyroid ok, no anemia, kidney/liver labs good. See u/s report. ______

## 2015-05-03 ENCOUNTER — Other Ambulatory Visit: Payer: Self-pay

## 2015-05-03 DIAGNOSIS — R1010 Upper abdominal pain, unspecified: Secondary | ICD-10-CM

## 2015-05-06 ENCOUNTER — Encounter (HOSPITAL_COMMUNITY): Payer: Self-pay

## 2015-05-06 ENCOUNTER — Inpatient Hospital Stay (HOSPITAL_COMMUNITY): Admission: RE | Admit: 2015-05-06 | Payer: Medicare Other | Source: Ambulatory Visit

## 2015-05-06 ENCOUNTER — Encounter (HOSPITAL_COMMUNITY)
Admission: RE | Admit: 2015-05-06 | Discharge: 2015-05-06 | Disposition: A | Discharge status: Home / Self Care | Payer: Medicare Other | Source: Ambulatory Visit | Attending: Gastroenterology | Admitting: Gastroenterology

## 2015-05-06 DIAGNOSIS — R1010 Upper abdominal pain, unspecified: Secondary | ICD-10-CM | POA: Diagnosis not present

## 2015-05-06 MED ORDER — SINCALIDE 5 MCG IJ SOLR
INTRAMUSCULAR | Status: AC
  Administered 2015-05-06: 1.45 ug
  Filled 2015-05-06: qty 5

## 2015-05-06 MED ORDER — TECHNETIUM TC 99M MEBROFENIN IV KIT
5.0000 | PACK | Freq: Once | INTRAVENOUS | Status: AC | PRN
  Administered 2015-05-06: 5 via INTRAVENOUS

## 2015-05-06 MED ORDER — STERILE WATER FOR INJECTION IJ SOLN
INTRAMUSCULAR | Status: AC
  Administered 2015-05-06: 1.45 mL
  Filled 2015-05-06: qty 10

## 2015-05-07 ENCOUNTER — Telehealth: Payer: Self-pay | Admitting: Internal Medicine

## 2015-05-07 NOTE — Telephone Encounter (Signed)
[phone]  PATIENT WAS TOLD TO CALL WITH AN UPDATE AND SHE IS THE SAME.  SWELLING WHEN EATING AND STILL BURPING.

## 2015-05-07 NOTE — Telephone Encounter (Signed)
Routing to LSL- pt had HIDA done Friday. Results are in [facility]

## 2015-05-08 NOTE — Progress Notes (Signed)
Quick Note:  Actually we could offer CT A/P for abdominal pain, bloating if she decides against EGD. I would recommend CT as alternative to EGD as opposed to the BPE/UGI. ______

## 2015-05-08 NOTE — Telephone Encounter (Signed)
See result notes. 

## 2015-05-08 NOTE — Progress Notes (Signed)
Quick Note:  GB function test is normal.  Only other recommendations right now for ongoing symptoms of dysphagia, upper abdominal discomfort, gerd is to offer EGD+/-ED OR BPE/UGI series vs change PPIs.  Let me know what she would like to do. ______

## 2015-05-09 ENCOUNTER — Telehealth: Payer: Self-pay | Admitting: Internal Medicine

## 2015-05-09 NOTE — Telephone Encounter (Signed)
I spoke with the pt.  

## 2015-05-09 NOTE — Telephone Encounter (Signed)
Pt called to check on results from radiology she had done Monday. I told her it's normally 7-10 business days. [phone]

## 2015-05-14 NOTE — Progress Notes (Signed)
Quick Note:    Noted    ______

## 2015-06-26 ENCOUNTER — Telehealth: Payer: Self-pay | Admitting: Internal Medicine

## 2015-06-26 ENCOUNTER — Other Ambulatory Visit: Payer: Self-pay

## 2015-06-26 DIAGNOSIS — Z1231 Encounter for screening mammogram for malignant neoplasm of breast: Secondary | ICD-10-CM

## 2015-06-26 NOTE — Telephone Encounter (Signed)
noted 

## 2015-06-26 NOTE — Telephone Encounter (Signed)
Pt called to say that she had stopped taking Omeprazole because she didn't think it was helping her and she has been taking Zantac and that has been working well for her. She just wanted us to know.

## 2015-07-10 ENCOUNTER — Ambulatory Visit: Payer: Medicare Other

## 2015-07-17 ENCOUNTER — Ambulatory Visit
Admission: RE | Admit: 2015-07-17 | Discharge: 2015-07-17 | Disposition: A | Discharge status: Home / Self Care | Payer: Medicare Other | Source: Ambulatory Visit

## 2015-07-17 DIAGNOSIS — Z1231 Encounter for screening mammogram for malignant neoplasm of breast: Secondary | ICD-10-CM

## 2015-08-13 ENCOUNTER — Other Ambulatory Visit (HOSPITAL_COMMUNITY): Payer: Self-pay | Admitting: Internal Medicine

## 2015-08-13 DIAGNOSIS — IMO0002 Reserved for concepts with insufficient information to code with codable children: Secondary | ICD-10-CM

## 2015-08-13 DIAGNOSIS — R229 Localized swelling, mass and lump, unspecified: Principal | ICD-10-CM

## 2015-08-27 ENCOUNTER — Encounter (HOSPITAL_COMMUNITY): Payer: Medicare Other

## 2015-09-03 ENCOUNTER — Ambulatory Visit (HOSPITAL_COMMUNITY)
Admission: RE | Admit: 2015-09-03 | Discharge: 2015-09-03 | Disposition: A | Discharge status: Home / Self Care | Payer: Medicare Other | Source: Ambulatory Visit | Attending: Internal Medicine | Admitting: Internal Medicine

## 2015-09-03 DIAGNOSIS — R229 Localized swelling, mass and lump, unspecified: Principal | ICD-10-CM

## 2015-09-03 DIAGNOSIS — IMO0002 Reserved for concepts with insufficient information to code with codable children: Secondary | ICD-10-CM

## 2015-10-04 ENCOUNTER — Ambulatory Visit (INDEPENDENT_AMBULATORY_CARE_PROVIDER_SITE_OTHER): Payer: Medicare Other | Admitting: Cardiology

## 2015-10-04 ENCOUNTER — Encounter: Payer: Self-pay | Admitting: Cardiology

## 2015-10-04 VITALS — BP 138/78 | HR 58 | Ht 60.0 in | Wt 158.0 lb

## 2015-10-04 DIAGNOSIS — R0789 Other chest pain: Secondary | ICD-10-CM

## 2015-10-04 DIAGNOSIS — I10 Essential (primary) hypertension: Secondary | ICD-10-CM

## 2015-10-04 DIAGNOSIS — R0602 Shortness of breath: Secondary | ICD-10-CM

## 2015-10-04 DIAGNOSIS — I2510 Atherosclerotic heart disease of native coronary artery without angina pectoris: Secondary | ICD-10-CM

## 2015-10-04 DIAGNOSIS — R600 Localized edema: Secondary | ICD-10-CM

## 2015-10-04 MED ORDER — PRAVASTATIN SODIUM 80 MG PO TABS: 80.0000 mg | ORAL_TABLET | Freq: Every evening | ORAL | Status: DC

## 2015-10-04 MED ORDER — FUROSEMIDE 20 MG PO TABS: ORAL_TABLET | ORAL | Status: DC

## 2015-10-04 NOTE — Patient Instructions (Signed)
Your physician recommends that you schedule a follow-up appointment in: to be determined   Your physician has requested that you have an echocardiogram. Echocardiography is a painless test that uses sound waves to create images of your heart. It provides your doctor with information about the size and shape of your heart and how well your heart's chambers and valves are working. This procedure takes approximately one hour. There are no restrictions for this procedure.    Take lasix 20 mg daily only as needed for swelling     Thank you for choosing [facility] !
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
## 2015-10-07 ENCOUNTER — Ambulatory Visit (HOSPITAL_COMMUNITY)
Admission: RE | Admit: 2015-10-07 | Discharge: 2015-10-07 | Disposition: A | Discharge status: Home / Self Care | Payer: Medicare Other | Source: Ambulatory Visit | Attending: Cardiology | Admitting: Cardiology

## 2015-10-07 DIAGNOSIS — K219 Gastro-esophageal reflux disease without esophagitis: Secondary | ICD-10-CM | POA: Insufficient documentation

## 2015-10-07 DIAGNOSIS — E785 Hyperlipidemia, unspecified: Secondary | ICD-10-CM | POA: Diagnosis not present

## 2015-10-07 DIAGNOSIS — I358 Other nonrheumatic aortic valve disorders: Secondary | ICD-10-CM | POA: Diagnosis not present

## 2015-10-07 DIAGNOSIS — R0602 Shortness of breath: Secondary | ICD-10-CM | POA: Diagnosis not present

## 2015-10-07 DIAGNOSIS — I2510 Atherosclerotic heart disease of native coronary artery without angina pectoris: Secondary | ICD-10-CM | POA: Insufficient documentation

## 2015-10-07 DIAGNOSIS — I059 Rheumatic mitral valve disease, unspecified: Secondary | ICD-10-CM | POA: Insufficient documentation

## 2015-10-07 LAB — ECHOCARDIOGRAM COMPLETE
CHL CUP DOP CALC LVOT VTI: 30.8 cm
CHL CUP STROKE VOLUME: 38 mL
E decel time: 183 msec
EERAT: 12.77
FS: 39 % (ref 28–44)
IV/PV OW: 0.95
LA ID, A-P, ES: 35 mm
LA diam end sys: 35 mm
LA vol A4C: 46 ml
LADIAMINDEX: 1.98 cm/m2
LAVOL: 47.2 mL
LAVOLIN: 26.7 mL/m2
LV E/e' medial: 12.77
LV SIMPSON'S DISK: 59
LV dias vol: 64 mL (ref 46–106)
LV sys vol: 27 mL (ref 14–42)
LVDIAVOLIN: 36 mL/m2
LVEEAVG: 12.77
LVELAT: 7.18 cm/s
LVOT area: 3.14 cm2
LVOT peak grad rest: 7 mmHg
LVOTD: 20 mm
LVOTPV: 132 cm/s
LVOTSV: 97 mL
LVSYSVOLIN: 15 mL/m2
Lateral S' vel: 12.9 cm/s
MV Dec: 183
MV Peak grad: 3 mmHg
MV pk A vel: 110 m/s
MVPKEVEL: 91.7 m/s
PW: 10.2 mm — AB (ref 0.6–1.1)
RV TAPSE: 21.2 mm
RV sys press: 18 mmHg
Reg peak vel: 195 cm/s
TDI e' lateral: 7.18
TDI e' medial: 6.64
TR max vel: 195 cm/s

## 2015-10-07 NOTE — Progress Notes (Signed)
*  PRELIMINARY RESULTS* Echocardiogram 2D Echocardiogram has been performed.  Stacey DrainWhite, Varie Machamer J 10/07/2015, 1:50 PM

## 2015-10-08 ENCOUNTER — Telehealth: Payer: Self-pay

## 2015-10-08 DIAGNOSIS — R0602 Shortness of breath: Secondary | ICD-10-CM

## 2015-10-08 NOTE — Telephone Encounter (Signed)
-----   Message from Antoine PocheJonathan F Branch, MD sent at 10/08/2015  2:13 PM EDT ----- Echo looks good, heart has normal pumping function. Please order a Lexiscan for her, needs to hold her norvasc day of the test  Dominga FerryJ Branch MD

## 2015-10-08 NOTE — Telephone Encounter (Signed)
Forward to MGM MIR[employer]erry Goins to schedule Lexi

## 2015-10-14 ENCOUNTER — Encounter (HOSPITAL_COMMUNITY): Payer: Self-pay

## 2015-10-14 ENCOUNTER — Encounter (HOSPITAL_COMMUNITY)
Admission: RE | Admit: 2015-10-14 | Discharge: 2015-10-14 | Disposition: A | Discharge status: Home / Self Care | Payer: Medicare Other | Source: Ambulatory Visit | Attending: Cardiology | Admitting: Cardiology

## 2015-10-14 ENCOUNTER — Inpatient Hospital Stay (HOSPITAL_COMMUNITY): Admission: RE | Admit: 2015-10-14 | Payer: Medicare Other | Source: Ambulatory Visit

## 2015-10-14 DIAGNOSIS — R0602 Shortness of breath: Secondary | ICD-10-CM

## 2015-10-14 LAB — NM MYOCAR MULTI W/SPECT W/WALL MOTION / EF
CHL CUP MPHR: 152 {beats}/min
CSEPHR: 90 %
Estimated workload: 7.3 METS
Exercise duration (min): 6 min
Exercise duration (sec): 7 s
LHR: 0.47
LVDIAVOL: 66 mL (ref 46–106)
LVSYSVOL: 20 mL
NUC STRESS TID: 1.2
Peak HR: 137 {beats}/min
RPE: 13
Rest HR: 60 {beats}/min
SDS: 1
SRS: 0
SSS: 1

## 2015-10-14 MED ORDER — SODIUM CHLORIDE 0.9% FLUSH
INTRAVENOUS | Status: AC
  Administered 2015-10-14: 10 mL via INTRAVENOUS
  Filled 2015-10-14: qty 10

## 2015-10-14 MED ORDER — TECHNETIUM TC 99M TETROFOSMIN IV KIT
10.0000 | PACK | Freq: Once | INTRAVENOUS | Status: AC | PRN
  Administered 2015-10-14: 9 via INTRAVENOUS

## 2015-10-14 MED ORDER — REGADENOSON 0.4 MG/5ML IV SOLN
INTRAVENOUS | Status: AC
  Filled 2015-10-14: qty 5

## 2015-10-14 MED ORDER — TECHNETIUM TC 99M TETROFOSMIN IV KIT
30.0000 | PACK | Freq: Once | INTRAVENOUS | Status: AC | PRN
  Administered 2015-10-14: 30 via INTRAVENOUS

## 2015-10-15 ENCOUNTER — Telehealth: Payer: Self-pay

## 2015-10-15 NOTE — Telephone Encounter (Signed)
Called pt- left voicemail to call back regarding results.

## 2015-10-29 ENCOUNTER — Ambulatory Visit: Payer: Medicare Other | Admitting: Physician Assistant

## 2015-10-29 ENCOUNTER — Ambulatory Visit (INDEPENDENT_AMBULATORY_CARE_PROVIDER_SITE_OTHER): Payer: Medicare Other | Admitting: Physician Assistant

## 2015-10-29 ENCOUNTER — Encounter: Payer: Self-pay | Admitting: Physician Assistant

## 2015-10-29 VITALS — BP 146/60 | HR 61 | Ht 60.0 in | Wt 158.0 lb

## 2015-10-29 DIAGNOSIS — I5189 Other ill-defined heart diseases: Secondary | ICD-10-CM

## 2015-10-29 DIAGNOSIS — R0602 Shortness of breath: Secondary | ICD-10-CM

## 2015-10-29 DIAGNOSIS — I2510 Atherosclerotic heart disease of native coronary artery without angina pectoris: Secondary | ICD-10-CM

## 2015-10-29 DIAGNOSIS — I519 Heart disease, unspecified: Secondary | ICD-10-CM

## 2015-10-29 DIAGNOSIS — I10 Essential (primary) hypertension: Secondary | ICD-10-CM

## 2015-10-29 DIAGNOSIS — R002 Palpitations: Secondary | ICD-10-CM

## 2015-10-29 DIAGNOSIS — R600 Localized edema: Secondary | ICD-10-CM

## 2015-10-29 NOTE — Patient Instructions (Addendum)
Your physician recommends that you schedule a follow-up appointment in: 3-4 Months with Dr. Wyline MoodBranch.   Your physician has recommended you make the following change in your medication:  Increase Lasix to 20 mg Every other Day.   Your physician has requested that you regularly monitor and record your blood pressure readings at home. Please use the same machine at the same time of day to check your readings and record them to bring to your follow-up visit. Please call if running above 130/ 85.   Your physician recommends that you return for lab work in: 1 Week  Please increase dietary intake of healthy sources of dietary intake of potassium including bananas, squash, yogurt, white beans, sweet potatoes, leafy greens, and avocados.  If you need a refill on your cardiac medications before your next appointment, please call your pharmacy.  Thank you for choosing [facility]!
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
## 2015-11-06 LAB — BASIC METABOLIC PANEL
BUN: 15 mg/dL (ref 7–25)
CALCIUM: 9.3 mg/dL (ref 8.6–10.4)
CHLORIDE: 106 mmol/L (ref 98–110)
CO2: 27 mmol/L (ref 20–31)
Creat: 0.78 mg/dL (ref 0.50–0.99)
GLUCOSE: 93 mg/dL (ref 65–99)
Potassium: 4.3 mmol/L (ref 3.5–5.3)
SODIUM: 141 mmol/L (ref 135–146)

## 2015-11-06 LAB — BRAIN NATRIURETIC PEPTIDE: BRAIN NATRIURETIC PEPTIDE: 25.3 pg/mL (ref <100)

## 2015-11-11 ENCOUNTER — Encounter: Payer: Self-pay | Admitting: Cardiology

## 2016-03-03 ENCOUNTER — Encounter: Payer: Self-pay | Admitting: Cardiology

## 2016-03-03 ENCOUNTER — Ambulatory Visit: Payer: Medicare Other | Admitting: Cardiology
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
## 2016-07-27 ENCOUNTER — Other Ambulatory Visit: Payer: Self-pay | Admitting: Internal Medicine

## 2016-07-27 DIAGNOSIS — Z1231 Encounter for screening mammogram for malignant neoplasm of breast: Secondary | ICD-10-CM

## 2016-09-03 ENCOUNTER — Ambulatory Visit
Admission: RE | Admit: 2016-09-03 | Discharge: 2016-09-03 | Disposition: A | Discharge status: Home / Self Care | Payer: Medicare Other | Source: Ambulatory Visit | Attending: Internal Medicine | Admitting: Internal Medicine

## 2016-09-03 DIAGNOSIS — Z1231 Encounter for screening mammogram for malignant neoplasm of breast: Secondary | ICD-10-CM

## 2016-09-04 ENCOUNTER — Other Ambulatory Visit: Payer: Self-pay | Admitting: Internal Medicine

## 2016-09-04 DIAGNOSIS — R928 Other abnormal and inconclusive findings on diagnostic imaging of breast: Secondary | ICD-10-CM

## 2016-09-08 ENCOUNTER — Ambulatory Visit
Admission: RE | Admit: 2016-09-08 | Discharge: 2016-09-08 | Disposition: A | Discharge status: Home / Self Care | Payer: Medicare Other | Source: Ambulatory Visit | Attending: Internal Medicine | Admitting: Internal Medicine

## 2016-09-08 ENCOUNTER — Other Ambulatory Visit: Payer: Self-pay | Admitting: Internal Medicine

## 2016-09-08 DIAGNOSIS — N6310 Unspecified lump in the right breast, unspecified quadrant: Secondary | ICD-10-CM

## 2016-09-08 DIAGNOSIS — R928 Other abnormal and inconclusive findings on diagnostic imaging of breast: Secondary | ICD-10-CM

## 2016-09-10 ENCOUNTER — Ambulatory Visit
Admission: RE | Admit: 2016-09-10 | Discharge: 2016-09-10 | Disposition: A | Discharge status: Home / Self Care | Payer: Medicare Other | Source: Ambulatory Visit | Attending: Internal Medicine | Admitting: Internal Medicine

## 2016-09-10 ENCOUNTER — Other Ambulatory Visit: Payer: Self-pay | Admitting: Internal Medicine

## 2016-09-10 DIAGNOSIS — N6310 Unspecified lump in the right breast, unspecified quadrant: Secondary | ICD-10-CM

## 2016-09-10 DIAGNOSIS — R928 Other abnormal and inconclusive findings on diagnostic imaging of breast: Secondary | ICD-10-CM

## 2016-10-02 ENCOUNTER — Ambulatory Visit: Payer: Self-pay | Admitting: Surgery

## 2016-10-02 DIAGNOSIS — N6310 Unspecified lump in the right breast, unspecified quadrant: Secondary | ICD-10-CM

## 2016-10-02 NOTE — H&P (Signed)
History of Present Illness [name] [name]. [name] MD; 10/02/2016 5:53 PM) The patient is a 71 year old female who presents with a breast mass. Referred by [name] [name] for right breast mass Cardiology - Dr. Harl Bowie, Linna Hoff  This is a 71 yo female who presents after recent screening mammogram that showed a possible mass in the right breast. Prior to mammogram, she had some left breast tenderness that actually began with last year's mammogram that resulted in significant bruising. There were no left breast findings on mammogram this year. She underwent further work-up that showed a 7 x 5 x 6 mm mass in the right breast at 0900, 5 CMFN. Axilla was normal. Biopsy showed only fibrocystic changes and was felt to be discordant, so the patient was referred for surgical biopsy.  CLINICAL DATA: Screening.  EXAM: 2D DIGITAL SCREENING BILATERAL MAMMOGRAM WITH CAD AND ADJUNCT TOMO  COMPARISON: Previous exam(s).  ACR Breast Density Category b: There are scattered areas of fibroglandular density.  FINDINGS: In the right breast, a possible mass warrants further evaluation. In the left breast, no findings suspicious for malignancy. Images were processed with CAD.  IMPRESSION: Further evaluation is suggested for possible mass in the right breast.  RECOMMENDATION: Diagnostic mammogram and possibly ultrasound of the right breast. (Code:[id number])  The patient will be contacted regarding the findings, and additional imaging will be scheduled.  BI-RADS CATEGORY 0: Incomplete. Need additional imaging evaluation and/or prior mammograms for comparison.   Electronically Signed By: [name] [name] M.D. On: 09/03/2016 11:07  CLINICAL DATA: Screening recall for a possible mass in the right breast.  EXAM: 2D DIGITAL DIAGNOSTIC RIGHT MAMMOGRAM WITH CAD AND ADJUNCT TOMO  ULTRASOUND RIGHT BREAST  COMPARISON: Previous exam(s).  ACR Breast Density Category b: There are scattered areas  of fibroglandular density.  FINDINGS: In the right breast, there are several small nodular densities laterally on the CC view. There is also a subtle area of architectural distortion. This is associated with 1 of the small nodular densities.  Mammographic images were processed with CAD.  On physical exam, no mass is palpated in the lateral right breast.  Targeted ultrasound is performed, showing small irregular hypoechoic mass in the right breast at 9 o'clock, 5 cm the nipple, middle depth, measuring 7 x 5 x 6 mm. This corresponds in location to the small mammographic mass and architectural distortion.  Sonographic evaluation of the right axilla shows no enlarged or abnormal lymph nodes.  IMPRESSION: 1. Suspicious small lateral right breast mass with associated architectural distortion.  RECOMMENDATION: Ultrasound-guided core needle biopsy.  I have discussed the findings and recommendations with the patient. Results were also provided in writing at the conclusion of the visit. If applicable, a reminder letter will be sent to the patient regarding the next appointment.  BI-RADS CATEGORY 4: Suspicious.   Electronically Signed By: [name] [name] M.D. On: 09/08/2016 12:02  CLINICAL DATA: Patient presents for ultrasound-guided core needle biopsy of a 7 mm irregular mass with distortion over the 9 o'clock position of the right breast 5 cm from the nipple.  EXAM: ULTRASOUND GUIDED RIGHT BREAST CORE NEEDLE BIOPSY  COMPARISON: Previous exam(s).  FINDINGS: I met with the patient and we discussed the procedure of ultrasound-guided biopsy, including benefits and alternatives. We discussed the high likelihood of a successful procedure. We discussed the risks of the procedure, including infection, bleeding, tissue injury, clip migration, and inadequate sampling. Informed written consent was given. The usual time-out protocol was performed immediately prior to the  procedure.  Lesion quadrant: Right upper outer quadrant at the 9:30 position.  Using sterile technique and 1% Lidocaine as local anesthetic, under direct ultrasound visualization, a 14 gauge spring-loaded device was used to perform biopsy of the targeted mass at the 9 o'clock position using a inferior to superior approach. Three adequate tissue specimens were obtained. At the conclusion of the procedure a ribbon shaped tissue marker clip was deployed into the biopsy cavity. Follow up 2 view mammogram was performed and dictated separately.  IMPRESSION: Ultrasound guided biopsy of suspicious right breast mass. No apparent complications.  Electronically Signed: By: [name] [name] M.D. On: 09/10/2016 13:39  ADDENDUM: Pathology revealed USUAL DUCTAL HYPERPLASIA, FIBROCYSTIC CHANGES, of the Right breast, 9:00 o'clock, 5CMFN. This was found to be discordant by Dr. Marin [name], with excision recommended. Pathology results were discussed with the patient by telephone. The patient reported doing well after the biopsy with tenderness at the site. Post biopsy instructions and care were reviewed and questions were answered. The patient was encouraged to call [facility] for any additional concerns. Surgical consultation has been arranged with Dr. Donnie [name] at [facility] on October 02, 2016.  Pathology results reported by [name] Purser, RN on 09/11/2016.     Past Surgical History [name] [name], [place]; 10/02/2016 10:13 AM) Aneurysm Repair Breast Biopsy Right. Cataract Surgery Bilateral. Knee Surgery Right.  Diagnostic Studies History [name] [name], [place]; 10/02/2016 10:13 AM) Colonoscopy 1-5 years ago  Allergies [name] [name], RMA; 10/02/2016 10:13 AM) No Known Allergies 10/02/2016  Medication History [name] [name], RMA; 10/02/2016 10:14 AM) Zolpidem Tartrate (10MG Tablet, Oral) Active. AmLODIPine Besylate (2.5MG Tablet,  Oral) Active. Atorvastatin Calcium (20MG Tablet, Oral) Active. Loratadine (10MG Tablet, Oral) Active. Nitroglycerin (0.4MG Tab Sublingual, Sublingual) Active. Zantac (150MG Tablet, Oral) Active. Aspirin (81MG Tablet, Oral) Active. Medications Reconciled  Social History [name] [name], [place]; 10/02/2016 10:13 AM) Alcohol use Occasional alcohol use. No drug use Tobacco use Never smoker.  Family History [name] [name], [place]; 10/02/2016 10:13 AM) Family history unknown First Degree Relatives  Pregnancy / Birth History [name] [name], [place]; 10/02/2016 10:13 AM) Contraceptive History Oral contraceptives. Gravida 2 Maternal age 52-20  Other Problems [name] [name], [place]; 10/02/2016 10:13 AM) Arthritis Chest pain Hypercholesterolemia     Review of Systems [name] [name] RMA; 10/02/2016 10:13 AM) General Present- Fatigue and Weight Gain. Not Present- Appetite Loss, Chills, Fever, Night Sweats and Weight Loss. HEENT Present- Hearing Loss and Ringing in the Ears. Not Present- Earache, Hoarseness, Nose Bleed, Oral Ulcers, Seasonal Allergies, Sinus Pain, Sore Throat, Visual Disturbances, Wears glasses/contact lenses and Yellow Eyes. Respiratory Not Present- Bloody sputum, Chronic Cough, Difficulty Breathing, Snoring and Wheezing. Breast Present- Breast Mass. Not Present- Breast Pain, Nipple Discharge and Skin Changes. Gastrointestinal Present- Abdominal Pain. Not Present- Bloating, Bloody Stool, Change in Bowel Habits, Chronic diarrhea, Constipation, Difficulty Swallowing, Excessive gas, Gets full quickly at meals, Hemorrhoids, Indigestion, Nausea, Rectal Pain and Vomiting. Musculoskeletal Not Present- Back Pain, Joint Pain, Joint Stiffness, Muscle Pain, Muscle Weakness and Swelling of Extremities. Hematology Present- Easy Bruising. Not Present- Blood Thinners, Excessive bleeding, Gland problems, HIV and Persistent Infections.  Vitals [name] [name] RMA; 10/02/2016  10:14 AM) 10/02/2016 10:14 AM Weight: 167 lb Temp.: 98.52F  Pulse: 88 (Regular)  BP: 122/90 (Sitting, Left Arm, Standard)      Physical Exam Rodman Key K. Ahmadou Bolz MD; 10/02/2016 5:53 PM)  The physical exam findings are as follows: Note:WDWN in NAD Eyes: Pupils equal, round; sclera anicteric HENT: Oral mucosa moist; good dentition Neck: No masses  palpated, no thyromegaly Lungs: CTA bilaterally; normal respiratory effort Breasts: symmetric; no palpable axillary lymphadenopathy; no dominant masses; bilateral fibrocystic changes; no nipple retraction or discharge CV: Regular rate and rhythm; no murmurs; extremities well-perfused with no edema Abd: +bowel sounds, soft, non-tender, no palpable organomegaly; no palpable hernias Skin: Warm, dry; no sign of jaundice Psychiatric - alert and oriented x 4; calm mood and affect    Assessment & Plan Rodman Key K. Gerard Bonus MD; 10/02/2016 10:28 AM)  MASS OF RIGHT BREAST ON MAMMOGRAM (N63.10)  Current Plans Schedule for Surgery - Right radioactive seed localized lumpectomy. The surgical procedure has been discussed with the patient. Potential risks, benefits, alternative treatments, and expected outcomes have been explained. All of the patient's questions at this time have been answered. The likelihood of reaching the patient's treatment goal is good. The patient understand the proposed surgical procedure and wishes to proceed.  [name] [name]. Georgette Dover, MD, [facility]  General/ Trauma Surgery  10/02/2016 5:54 PM

## 2016-10-22 ENCOUNTER — Other Ambulatory Visit: Payer: Self-pay | Admitting: Surgery

## 2016-10-22 DIAGNOSIS — N6310 Unspecified lump in the right breast, unspecified quadrant: Secondary | ICD-10-CM

## 2016-10-28 HISTORY — PX: BREAST LUMPECTOMY: SHX2

## 2016-11-05 ENCOUNTER — Encounter (HOSPITAL_BASED_OUTPATIENT_CLINIC_OR_DEPARTMENT_OTHER): Payer: Self-pay | Admitting: *Deleted

## 2016-11-06 ENCOUNTER — Encounter (HOSPITAL_BASED_OUTPATIENT_CLINIC_OR_DEPARTMENT_OTHER): Payer: Self-pay | Admitting: *Deleted

## 2016-11-09 ENCOUNTER — Encounter (HOSPITAL_BASED_OUTPATIENT_CLINIC_OR_DEPARTMENT_OTHER)
Admission: RE | Admit: 2016-11-09 | Discharge: 2016-11-09 | Disposition: A | Discharge status: Home / Self Care | Payer: Medicare Other | Source: Ambulatory Visit | Attending: Surgery | Admitting: Surgery

## 2016-11-09 DIAGNOSIS — I10 Essential (primary) hypertension: Secondary | ICD-10-CM

## 2016-11-09 DIAGNOSIS — N6310 Unspecified lump in the right breast, unspecified quadrant: Secondary | ICD-10-CM | POA: Diagnosis present

## 2016-11-09 DIAGNOSIS — M1990 Unspecified osteoarthritis, unspecified site: Secondary | ICD-10-CM | POA: Diagnosis not present

## 2016-11-09 DIAGNOSIS — K219 Gastro-esophageal reflux disease without esophagitis: Secondary | ICD-10-CM | POA: Diagnosis not present

## 2016-11-09 DIAGNOSIS — Z79899 Other long term (current) drug therapy: Secondary | ICD-10-CM | POA: Diagnosis not present

## 2016-11-09 DIAGNOSIS — I2510 Atherosclerotic heart disease of native coronary artery without angina pectoris: Secondary | ICD-10-CM | POA: Diagnosis not present

## 2016-11-09 DIAGNOSIS — Z7982 Long term (current) use of aspirin: Secondary | ICD-10-CM | POA: Diagnosis not present

## 2016-11-09 DIAGNOSIS — E7800 Pure hypercholesterolemia, unspecified: Secondary | ICD-10-CM | POA: Diagnosis not present

## 2016-11-09 DIAGNOSIS — G4730 Sleep apnea, unspecified: Secondary | ICD-10-CM | POA: Diagnosis not present

## 2016-11-09 DIAGNOSIS — Z01810 Encounter for preprocedural cardiovascular examination: Secondary | ICD-10-CM

## 2016-11-09 DIAGNOSIS — R51 Headache: Secondary | ICD-10-CM | POA: Diagnosis not present

## 2016-11-09 DIAGNOSIS — N6091 Unspecified benign mammary dysplasia of right breast: Secondary | ICD-10-CM | POA: Diagnosis not present

## 2016-11-09 NOTE — Progress Notes (Addendum)
Ensure Presurgery drink given with instructions to complete by 0600 dos, pt verbalized understanding.  EKG reviewed by Dr. Hyacinth Meeker, will proceed with surgery as scheduled.

## 2016-11-11 ENCOUNTER — Ambulatory Visit
Admission: RE | Admit: 2016-11-11 | Discharge: 2016-11-11 | Disposition: A | Discharge status: Home / Self Care | Payer: Medicare Other | Source: Ambulatory Visit | Attending: Surgery | Admitting: Surgery

## 2016-11-11 DIAGNOSIS — N6310 Unspecified lump in the right breast, unspecified quadrant: Secondary | ICD-10-CM

## 2016-11-12 ENCOUNTER — Ambulatory Visit (HOSPITAL_BASED_OUTPATIENT_CLINIC_OR_DEPARTMENT_OTHER): Payer: Medicare Other | Admitting: Anesthesiology

## 2016-11-12 ENCOUNTER — Ambulatory Visit
Admission: RE | Admit: 2016-11-12 | Discharge: 2016-11-12 | Disposition: A | Discharge status: Home / Self Care | Payer: Medicare Other | Source: Ambulatory Visit | Attending: Surgery | Admitting: Surgery

## 2016-11-12 ENCOUNTER — Encounter (HOSPITAL_BASED_OUTPATIENT_CLINIC_OR_DEPARTMENT_OTHER)
Admission: RE | Disposition: A | Discharge status: Home / Self Care | Payer: Self-pay | Source: Ambulatory Visit | Attending: Surgery

## 2016-11-12 ENCOUNTER — Encounter (HOSPITAL_BASED_OUTPATIENT_CLINIC_OR_DEPARTMENT_OTHER): Payer: Self-pay | Admitting: *Deleted

## 2016-11-12 ENCOUNTER — Ambulatory Visit (HOSPITAL_BASED_OUTPATIENT_CLINIC_OR_DEPARTMENT_OTHER)
Admission: RE | Admit: 2016-11-12 | Discharge: 2016-11-12 | Disposition: A | Discharge status: Home / Self Care | Payer: Medicare Other | Source: Ambulatory Visit | Attending: Surgery | Admitting: Surgery

## 2016-11-12 DIAGNOSIS — M1990 Unspecified osteoarthritis, unspecified site: Secondary | ICD-10-CM | POA: Insufficient documentation

## 2016-11-12 DIAGNOSIS — N6091 Unspecified benign mammary dysplasia of right breast: Secondary | ICD-10-CM | POA: Diagnosis not present

## 2016-11-12 DIAGNOSIS — I2510 Atherosclerotic heart disease of native coronary artery without angina pectoris: Secondary | ICD-10-CM | POA: Diagnosis not present

## 2016-11-12 DIAGNOSIS — R51 Headache: Secondary | ICD-10-CM | POA: Insufficient documentation

## 2016-11-12 DIAGNOSIS — Z79899 Other long term (current) drug therapy: Secondary | ICD-10-CM | POA: Insufficient documentation

## 2016-11-12 DIAGNOSIS — G4730 Sleep apnea, unspecified: Secondary | ICD-10-CM | POA: Insufficient documentation

## 2016-11-12 DIAGNOSIS — E7800 Pure hypercholesterolemia, unspecified: Secondary | ICD-10-CM | POA: Insufficient documentation

## 2016-11-12 DIAGNOSIS — K219 Gastro-esophageal reflux disease without esophagitis: Secondary | ICD-10-CM | POA: Insufficient documentation

## 2016-11-12 DIAGNOSIS — N6310 Unspecified lump in the right breast, unspecified quadrant: Secondary | ICD-10-CM

## 2016-11-12 DIAGNOSIS — I10 Essential (primary) hypertension: Secondary | ICD-10-CM | POA: Insufficient documentation

## 2016-11-12 DIAGNOSIS — Z7982 Long term (current) use of aspirin: Secondary | ICD-10-CM | POA: Insufficient documentation

## 2016-11-12 HISTORY — PX: BREAST LUMPECTOMY WITH RADIOACTIVE SEED LOCALIZATION: SHX6424

## 2016-11-12 HISTORY — DX: Sleep apnea, unspecified: G47.30

## 2016-11-12 HISTORY — PX: BREAST EXCISIONAL BIOPSY: SUR124

## 2016-11-12 SURGERY — BREAST LUMPECTOMY WITH RADIOACTIVE SEED LOCALIZATION
Anesthesia: General | Site: Breast | Laterality: Right

## 2016-11-12 MED ORDER — LACTATED RINGERS IV SOLN
INTRAVENOUS | Status: DC
  Administered 2016-11-12: 08:00:00 via INTRAVENOUS

## 2016-11-12 MED ORDER — FENTANYL CITRATE (PF) 100 MCG/2ML IJ SOLN
INTRAMUSCULAR | Status: AC
  Filled 2016-11-12: qty 2

## 2016-11-12 MED ORDER — ONDANSETRON HCL 4 MG/2ML IJ SOLN
INTRAMUSCULAR | Status: AC
  Filled 2016-11-12: qty 2

## 2016-11-12 MED ORDER — CHLORHEXIDINE GLUCONATE CLOTH 2 % EX PADS: 6.0000 | MEDICATED_PAD | Freq: Once | CUTANEOUS | Status: DC

## 2016-11-12 MED ORDER — LIDOCAINE 2% (20 MG/ML) 5 ML SYRINGE
INTRAMUSCULAR | Status: AC
  Filled 2016-11-12: qty 5

## 2016-11-12 MED ORDER — FENTANYL CITRATE (PF) 100 MCG/2ML IJ SOLN
50.0000 ug | INTRAMUSCULAR | Status: DC | PRN
  Administered 2016-11-12: 50 ug via INTRAVENOUS

## 2016-11-12 MED ORDER — SCOPOLAMINE 1 MG/3DAYS TD PT72: 1.0000 | MEDICATED_PATCH | Freq: Once | TRANSDERMAL | Status: DC | PRN

## 2016-11-12 MED ORDER — DEXAMETHASONE SODIUM PHOSPHATE 4 MG/ML IJ SOLN
INTRAMUSCULAR | Status: DC | PRN
  Administered 2016-11-12: 10 mg via INTRAVENOUS

## 2016-11-12 MED ORDER — LIDOCAINE 2% (20 MG/ML) 5 ML SYRINGE
INTRAMUSCULAR | Status: DC | PRN
  Administered 2016-11-12: 50 mg via INTRAVENOUS

## 2016-11-12 MED ORDER — ACETAMINOPHEN 500 MG PO TABS
1000.0000 mg | ORAL_TABLET | ORAL | Status: AC
  Administered 2016-11-12: 1000 mg via ORAL

## 2016-11-12 MED ORDER — EPHEDRINE 5 MG/ML INJ
INTRAVENOUS | Status: AC
  Filled 2016-11-12: qty 10

## 2016-11-12 MED ORDER — CEFAZOLIN SODIUM-DEXTROSE 2-4 GM/100ML-% IV SOLN
INTRAVENOUS | Status: AC
  Filled 2016-11-12: qty 100

## 2016-11-12 MED ORDER — GABAPENTIN 300 MG PO CAPS
ORAL_CAPSULE | ORAL | Status: AC
  Filled 2016-11-12: qty 1

## 2016-11-12 MED ORDER — CEFAZOLIN SODIUM-DEXTROSE 2-4 GM/100ML-% IV SOLN
2.0000 g | INTRAVENOUS | Status: AC
  Administered 2016-11-12: 2 g via INTRAVENOUS

## 2016-11-12 MED ORDER — DEXAMETHASONE SODIUM PHOSPHATE 10 MG/ML IJ SOLN
INTRAMUSCULAR | Status: AC
  Filled 2016-11-12: qty 1

## 2016-11-12 MED ORDER — FENTANYL CITRATE (PF) 100 MCG/2ML IJ SOLN
25.0000 ug | INTRAMUSCULAR | Status: DC | PRN
  Administered 2016-11-12: 25 ug via INTRAVENOUS

## 2016-11-12 MED ORDER — ACETAMINOPHEN 500 MG PO TABS
ORAL_TABLET | ORAL | Status: AC
  Filled 2016-11-12: qty 2

## 2016-11-12 MED ORDER — BUPIVACAINE-EPINEPHRINE (PF) 0.25% -1:200000 IJ SOLN
INTRAMUSCULAR | Status: AC
  Filled 2016-11-12: qty 90

## 2016-11-12 MED ORDER — PROPOFOL 10 MG/ML IV BOLUS
INTRAVENOUS | Status: DC | PRN
  Administered 2016-11-12: 150 mg via INTRAVENOUS

## 2016-11-12 MED ORDER — BUPIVACAINE-EPINEPHRINE 0.25% -1:200000 IJ SOLN
INTRAMUSCULAR | Status: DC | PRN
  Administered 2016-11-12: 10 mL

## 2016-11-12 MED ORDER — MIDAZOLAM HCL 2 MG/2ML IJ SOLN: 1.0000 mg | INTRAMUSCULAR | Status: DC | PRN

## 2016-11-12 MED ORDER — HYDROCODONE-ACETAMINOPHEN 5-325 MG PO TABS: 1.0000 | ORAL_TABLET | Freq: Four times a day (QID) | ORAL | 0 refills | Status: DC | PRN

## 2016-11-12 MED ORDER — EPHEDRINE SULFATE 50 MG/ML IJ SOLN
INTRAMUSCULAR | Status: DC | PRN
  Administered 2016-11-12 (×2): 10 mg via INTRAVENOUS

## 2016-11-12 MED ORDER — GABAPENTIN 300 MG PO CAPS
300.0000 mg | ORAL_CAPSULE | ORAL | Status: AC
  Administered 2016-11-12: 300 mg via ORAL

## 2016-11-12 SURGICAL SUPPLY — 54 items
APL SKNCLS STERI-STRIP NONHPOA (GAUZE/BANDAGES/DRESSINGS) ×1
APPLIER CLIP 9.375 MED OPEN (MISCELLANEOUS) ×2
APR CLP MED 9.3 20 MLT OPN (MISCELLANEOUS) ×1
BENZOIN TINCTURE PRP APPL 2/3 (GAUZE/BANDAGES/DRESSINGS) ×2 IMPLANT
BLADE HEX COATED 2.75 (ELECTRODE) ×2 IMPLANT
BLADE SURG 15 STRL LF DISP TIS (BLADE) ×1 IMPLANT
BLADE SURG 15 STRL SS (BLADE) ×2
CANISTER SUC SOCK COL 7IN (MISCELLANEOUS) IMPLANT
CANISTER SUCT 1200ML W/VALVE (MISCELLANEOUS) ×2 IMPLANT
CHLORAPREP W/TINT 26ML (MISCELLANEOUS) ×2 IMPLANT
CLIP APPLIE 9.375 MED OPEN (MISCELLANEOUS) ×1 IMPLANT
COVER BACK TABLE 60X90IN (DRAPES) ×2 IMPLANT
COVER MAYO STAND STRL (DRAPES) ×2 IMPLANT
COVER PROBE W GEL 5X96 (DRAPES) ×2 IMPLANT
DECANTER SPIKE VIAL GLASS SM (MISCELLANEOUS) IMPLANT
DEVICE DUBIN W/COMP PLATE 8390 (MISCELLANEOUS) ×2 IMPLANT
DRAPE LAPAROTOMY 100X72 PEDS (DRAPES) ×2 IMPLANT
DRAPE UTILITY XL STRL (DRAPES) ×2 IMPLANT
DRSG TEGADERM 4X4.75 (GAUZE/BANDAGES/DRESSINGS) ×2 IMPLANT
ELECT REM PT RETURN 9FT ADLT (ELECTROSURGICAL) ×2
ELECTRODE REM PT RTRN 9FT ADLT (ELECTROSURGICAL) ×1 IMPLANT
GAUZE SPONGE 4X4 12PLY STRL LF (GAUZE/BANDAGES/DRESSINGS) ×1 IMPLANT
GLOVE BIO SURGEON STRL SZ7 (GLOVE) ×2 IMPLANT
GLOVE BIOGEL PI IND STRL 7.0 (GLOVE) IMPLANT
GLOVE BIOGEL PI IND STRL 7.5 (GLOVE) ×1 IMPLANT
GLOVE BIOGEL PI INDICATOR 7.0 (GLOVE) ×1
GLOVE BIOGEL PI INDICATOR 7.5 (GLOVE) ×1
GLOVE ECLIPSE 6.5 STRL STRAW (GLOVE) ×1 IMPLANT
GLOVE EXAM NITRILE MD LF STRL (GLOVE) ×1 IMPLANT
GOWN STRL REUS W/ TWL LRG LVL3 (GOWN DISPOSABLE) ×2 IMPLANT
GOWN STRL REUS W/TWL LRG LVL3 (GOWN DISPOSABLE) ×4
ILLUMINATOR WAVEGUIDE N/F (MISCELLANEOUS) IMPLANT
KIT MARKER MARGIN INK (KITS) ×2 IMPLANT
LIGHT WAVEGUIDE WIDE FLAT (MISCELLANEOUS) IMPLANT
NDL HYPO 25X1 1.5 SAFETY (NEEDLE) ×1 IMPLANT
NEEDLE HYPO 25X1 1.5 SAFETY (NEEDLE) ×2 IMPLANT
NS IRRIG 1000ML POUR BTL (IV SOLUTION) ×2 IMPLANT
PACK BASIN DAY SURGERY FS (CUSTOM PROCEDURE TRAY) ×2 IMPLANT
PENCIL BUTTON HOLSTER BLD 10FT (ELECTRODE) ×2 IMPLANT
SLEEVE SCD COMPRESS KNEE MED (MISCELLANEOUS) ×2 IMPLANT
SPONGE GAUZE 2X2 8PLY STRL LF (GAUZE/BANDAGES/DRESSINGS) IMPLANT
SPONGE LAP 18X18 X RAY DECT (DISPOSABLE) IMPLANT
SPONGE LAP 4X18 X RAY DECT (DISPOSABLE) ×2 IMPLANT
STRIP CLOSURE SKIN 1/2X4 (GAUZE/BANDAGES/DRESSINGS) ×2 IMPLANT
SUT MON AB 4-0 PC3 18 (SUTURE) ×2 IMPLANT
SUT SILK 2 0 SH (SUTURE) IMPLANT
SUT VIC AB 3-0 SH 27 (SUTURE) ×2
SUT VIC AB 3-0 SH 27X BRD (SUTURE) ×1 IMPLANT
SYR BULB 3OZ (MISCELLANEOUS) IMPLANT
SYR CONTROL 10ML LL (SYRINGE) ×2 IMPLANT
TOWEL OR 17X24 6PK STRL BLUE (TOWEL DISPOSABLE) ×2 IMPLANT
TOWEL OR NON WOVEN STRL DISP B (DISPOSABLE) ×1 IMPLANT
TUBE CONNECTING 20X1/4 (TUBING) ×2 IMPLANT
YANKAUER SUCT BULB TIP NO VENT (SUCTIONS) ×2 IMPLANT

## 2016-11-12 NOTE — H&P (Signed)
History of Present Illness The patient is a 71 year old female who presents with a breast mass. Referred by Dr. Allyn Kenner for right breast mass Cardiology - Dr. Harl Bowie, Linna Hoff  This is a 71 yo female who presents after recent screening mammogram that showed a possible mass in the right breast. Prior to mammogram, she had some left breast tenderness that actually began with last year's mammogram that resulted in significant bruising. There were no left breast findings on mammogram this year. She underwent further work-up that showed a 7 x 5 x 6 mm mass in the right breast at 0900, 5 CMFN. Axilla was normal. Biopsy showed only fibrocystic changes and was felt to be discordant, so the patient was referred for surgical biopsy.  CLINICAL DATA: Screening.  EXAM: 2D DIGITAL SCREENING BILATERAL MAMMOGRAM WITH CAD AND ADJUNCT TOMO  COMPARISON: Previous exam(s).  ACR Breast Density Category b: There are scattered areas of fibroglandular density.  FINDINGS: In the right breast, a possible mass warrants further evaluation. In the left breast, no findings suspicious for malignancy. Images were processed with CAD.  IMPRESSION: Further evaluation is suggested for possible mass in the right breast.  RECOMMENDATION: Diagnostic mammogram and possibly ultrasound of the right breast. (Code:[id number])  The patient will be contacted regarding the findings, and additional imaging will be scheduled.  BI-RADS CATEGORY 0: Incomplete. Need additional imaging evaluation and/or prior mammograms for comparison.   Electronically Signed By: Marin Olp M.D. On: 09/03/2016 11:07  CLINICAL DATA: Screening recall for a possible mass in the right breast.  EXAM: 2D DIGITAL DIAGNOSTIC RIGHT MAMMOGRAM WITH CAD AND ADJUNCT TOMO  ULTRASOUND RIGHT BREAST  COMPARISON: Previous exam(s).  ACR Breast Density Category b: There are scattered areas of fibroglandular  density.  FINDINGS: In the right breast, there are several small nodular densities laterally on the CC view. There is also a subtle area of architectural distortion. This is associated with 1 of the small nodular densities.  Mammographic images were processed with CAD.  On physical exam, no mass is palpated in the lateral right breast.  Targeted ultrasound is performed, showing small irregular hypoechoic mass in the right breast at 9 o'clock, 5 cm the nipple, middle depth, measuring 7 x 5 x 6 mm. This corresponds in location to the small mammographic mass and architectural distortion.  Sonographic evaluation of the right axilla shows no enlarged or abnormal lymph nodes.  IMPRESSION: 1. Suspicious small lateral right breast mass with associated architectural distortion.  RECOMMENDATION: Ultrasound-guided core needle biopsy.  I have discussed the findings and recommendations with the patient. Results were also provided in writing at the conclusion of the visit. If applicable, a reminder letter will be sent to the patient regarding the next appointment.  BI-RADS CATEGORY 4: Suspicious.   Electronically Signed By: Lajean Manes M.D. On: 09/08/2016 12:02  CLINICAL DATA: Patient presents for ultrasound-guided core needle biopsy of a 7 mm irregular mass with distortion over the 9 o'clock position of the right breast 5 cm from the nipple.  EXAM: ULTRASOUND GUIDED RIGHT BREAST CORE NEEDLE BIOPSY  COMPARISON: Previous exam(s).  FINDINGS: I met with the patient and we discussed the procedure of ultrasound-guided biopsy, including benefits and alternatives. We discussed the high likelihood of a successful procedure. We discussed the risks of the procedure, including infection, bleeding, tissue injury, clip migration, and inadequate sampling. Informed written consent was given. The usual time-out protocol was performed immediately prior to the  procedure.  Lesion quadrant: Right upper outer quadrant at  the 9:30 position.  Using sterile technique and 1% Lidocaine as local anesthetic, under direct ultrasound visualization, a 14 gauge spring-loaded device was used to perform biopsy of the targeted mass at the 9 o'clock position using a inferior to superior approach. Three adequate tissue specimens were obtained. At the conclusion of the procedure a ribbon shaped tissue marker clip was deployed into the biopsy cavity. Follow up 2 view mammogram was performed and dictated separately.  IMPRESSION: Ultrasound guided biopsy of suspicious right breast mass. No apparent complications.  Electronically Signed: By: Marin Olp M.D. On: 09/10/2016 13:39  ADDENDUM: Pathology revealed USUAL DUCTAL HYPERPLASIA, FIBROCYSTIC CHANGES, of the Right breast, 9:00 o'clock, 5CMFN. This was found to be discordant by Dr. Marin Olp, with excision recommended. Pathology results were discussed with the patient by telephone. The patient reported doing well after the biopsy with tenderness at the site. Post biopsy instructions and care were reviewed and questions were answered. The patient was encouraged to call [facility] for any additional concerns. Surgical consultation has been arranged with Dr. Donnie Mesa at [facility] on October 02, 2016.  Pathology results reported by Terie Purser, RN on 09/11/2016.     Past Surgical History Aneurysm Repair Breast Biopsy Right. Cataract Surgery Bilateral. Knee Surgery Right.  Diagnostic Studies History  Colonoscopy 1-5 years ago  Allergies  No Known Allergies  Medication History  Zolpidem Tartrate (10MG Tablet, Oral) Active. AmLODIPine Besylate (2.5MG Tablet, Oral) Active. Atorvastatin Calcium (20MG Tablet, Oral) Active. Loratadine (10MG Tablet, Oral) Active. Nitroglycerin (0.4MG Tab Sublingual, Sublingual) Active. Zantac  (150MG Tablet, Oral) Active. Aspirin (81MG Tablet, Oral) Active. Medications Reconciled  Social History  Alcohol use Occasional alcohol use. No drug use Tobacco use Never smoker.  Family History  Family history unknown First Degree Relatives  Pregnancy / Birth History Contraceptive History Oral contraceptives. Gravida 2 Maternal age 49-20  Other Problems  Arthritis Chest pain Hypercholesterolemia     Review of Systems General Present- Fatigue and Weight Gain. Not Present- Appetite Loss, Chills, Fever, Night Sweats and Weight Loss. HEENT Present- Hearing Loss and Ringing in the Ears. Not Present- Earache, Hoarseness, Nose Bleed, Oral Ulcers, Seasonal Allergies, Sinus Pain, Sore Throat, Visual Disturbances, Wears glasses/contact lenses and Yellow Eyes. Respiratory Not Present- Bloody sputum, Chronic Cough, Difficulty Breathing, Snoring and Wheezing. Breast Present- Breast Mass. Not Present- Breast Pain, Nipple Discharge and Skin Changes. Gastrointestinal Present- Abdominal Pain. Not Present- Bloating, Bloody Stool, Change in Bowel Habits, Chronic diarrhea, Constipation, Difficulty Swallowing, Excessive gas, Gets full quickly at meals, Hemorrhoids, Indigestion, Nausea, Rectal Pain and Vomiting. Musculoskeletal Not Present- Back Pain, Joint Pain, Joint Stiffness, Muscle Pain, Muscle Weakness and Swelling of Extremities. Hematology Present- Easy Bruising. Not Present- Blood Thinners, Excessive bleeding, Gland problems, HIV and Persistent Infections.  Vitals  Weight: 167 lb Temp.: 98.78F  Pulse: 88 (Regular)  BP: 122/90 (Sitting, Left Arm, Standard)      Physical Exam   The physical exam findings are as follows: Note:WDWN in NAD Eyes: Pupils equal, round; sclera anicteric HENT: Oral mucosa moist; good dentition Neck: No masses palpated, no thyromegaly Lungs: CTA bilaterally; normal respiratory effort Breasts: symmetric; no palpable  axillary lymphadenopathy; no dominant masses; bilateral fibrocystic changes; no nipple retraction or discharge CV: Regular rate and rhythm; no murmurs; extremities well-perfused with no edema Abd: +bowel sounds, soft, non-tender, no palpable organomegaly; no palpable hernias Skin: Warm, dry; no sign of jaundice Psychiatric - alert and oriented x 4; calm mood and affect  Assessment & Plan   MASS OF RIGHT BREAST ON MAMMOGRAM (N63.10)  Current Plans Schedule for Surgery - Right radioactive seed localized lumpectomy. The surgical procedure has been discussed with the patient. Potential risks, benefits, alternative treatments, and expected outcomes have been explained. All of the patient's questions at this time have been answered. The likelihood of reaching the patient's treatment goal is good. The patient understand the proposed surgical procedure and wishes to proceed.   Imogene Burn. Georgette Dover, MD, [facility]  General/ Trauma Surgery  11/12/2016 10:01 AM

## 2016-11-12 NOTE — Discharge Instructions (Signed)
[facility] °Office Phone Number [phone] ° °BREAST BIOPSY/ PARTIAL MASTECTOMY: POST OP INSTRUCTIONS ° °Always review your discharge instruction sheet given to you by the facility where your surgery was performed. ° °IF YOU HAVE DISABILITY OR FAMILY LEAVE FORMS, YOU MUST BRING THEM TO THE OFFICE FOR PROCESSING.  DO NOT GIVE THEM TO YOUR DOCTOR. ° °1. A prescription for pain medication may be given to you upon discharge.  Take your pain medication as prescribed, if needed.  If narcotic pain medicine is not needed, then you may take acetaminophen (Tylenol) or ibuprofen (Advil) as needed. °2. Take your usually prescribed medications unless otherwise directed °3. If you need a refill on your pain medication, please contact your pharmacy.  They will contact our office to request authorization.  Prescriptions will not be filled after 5pm or on week-ends. °4. You should eat very light the first 24 hours after surgery, such as soup, crackers, pudding, etc.  Resume your normal diet the day after surgery. °5. Most patients will experience some swelling and bruising in the breast.  Ice packs and a good support bra will help.  Swelling and bruising can take several days to resolve.  °6. It is common to experience some constipation if taking pain medication after surgery.  Increasing fluid intake and taking a stool softener will usually help or prevent this problem from occurring.  A mild laxative (Milk of Magnesia or Miralax) should be taken according to package directions if there are no bowel movements after 48 hours. °7. Unless discharge instructions indicate otherwise, you may remove your bandages 24-48 hours after surgery, and you may shower at that time.  You may have steri-strips (small skin tapes) in place directly over the incision.  These strips should be left on the skin for 7-10 days.  If your surgeon used skin glue on the incision, you may shower in 24 hours.  The glue will flake off over the  next 2-3 weeks.  Any sutures or staples will be removed at the office during your follow-up visit. °8. ACTIVITIES:  You may resume regular daily activities (gradually increasing) beginning the next day.  Wearing a good support bra or sports bra minimizes pain and swelling.  You may have sexual intercourse when it is comfortable. °a. You may drive when you no longer are taking prescription pain medication, you can comfortably wear a seatbelt, and you can safely maneuver your car and apply brakes. °b. RETURN TO WORK:  ______________________________________________________________________________________ °9. You should see your doctor in the office for a follow-up appointment approximately two weeks after your surgery.  Your doctor’s nurse will typically make your follow-up appointment when she calls you with your pathology report.  Expect your pathology report 2-3 business days after your surgery.  You may call to check if you do not hear from us after three days. °10. OTHER INSTRUCTIONS: _______________________________________________________________________________________________ _____________________________________________________________________________________________________________________________________ °_____________________________________________________________________________________________________________________________________ °_____________________________________________________________________________________________________________________________________ ° °WHEN TO CALL YOUR DOCTOR: °1. Fever over 101.0 °2. Nausea and/or vomiting. °3. Extreme swelling or bruising. °4. Continued bleeding from incision. °5. Increased pain, redness, or drainage from the incision. ° °The clinic staff is available to answer your questions during regular business hours.  Please don’t hesitate to call and ask to speak to one of the nurses for clinical concerns.  If you have a medical emergency, go to the nearest  emergency room or call 911.  A surgeon from [facility] is always on call at the hospital. ° °For further questions, please visit [email or web address]  ° ° ° ° °  Post Anesthesia Home Care Instructions ° °Activity: °Get plenty of rest for the remainder of the day. A responsible individual must stay with you for 24 hours following the procedure.  °For the next 24 hours, DO NOT: °-Drive a car °-Operate machinery °-Drink alcoholic beverages °-Take any medication unless instructed by your physician °-Make any legal decisions or sign important papers. ° °Meals: °Start with liquid foods such as gelatin or soup. Progress to regular foods as tolerated. Avoid greasy, spicy, heavy foods. If nausea and/or vomiting occur, drink only clear liquids until the nausea and/or vomiting subsides. Call your physician if vomiting continues. ° °Special Instructions/Symptoms: °Your throat may feel dry or sore from the anesthesia or the breathing tube placed in your throat during surgery. If this causes discomfort, gargle with warm salt water. The discomfort should disappear within 24 hours. ° °If you had a scopolamine patch placed behind your ear for the management of post- operative nausea and/or vomiting: ° °1. The medication in the patch is effective for 72 hours, after which it should be removed.  Wrap patch in a tissue and discard in the trash. Wash hands thoroughly with soap and water. °2. You may remove the patch earlier than 72 hours if you experience unpleasant side effects which may include dry mouth, dizziness or visual disturbances. °3. Avoid touching the patch. Wash your hands with soap and water after contact with the patch. °  ° °

## 2016-11-12 NOTE — Anesthesia Procedure Notes (Signed)
Procedure Name: LMA Insertion Date/Time: 11/12/2016 10:15 AM Performed by: Caren Macadam Pre-anesthesia Checklist: Patient identified, Emergency Drugs available, Suction available and Patient being monitored Patient Re-evaluated:Patient Re-evaluated prior to induction Oxygen Delivery Method: Circle system utilized Preoxygenation: Pre-oxygenation with 100% oxygen Induction Type: IV induction Ventilation: Mask ventilation without difficulty LMA: LMA inserted LMA Size: 4.0 Number of attempts: 1 Placement Confirmation: positive ETCO2 and breath sounds checked- equal and bilateral Tube secured with: Tape Dental Injury: Teeth and Oropharynx as per pre-operative assessment

## 2016-11-12 NOTE — Anesthesia Preprocedure Evaluation (Signed)
Anesthesia Evaluation  Patient identified by MRN, date of birth, ID band  Reviewed: Allergy & Precautions, NPO status , Patient's Chart, lab work & pertinent test results  Airway Mallampati: II  TM Distance: >3 FB     Dental   Pulmonary sleep apnea ,    breath sounds clear to auscultation       Cardiovascular hypertension, + CAD   Rhythm:Regular Rate:Normal     Neuro/Psych  Headaches,    GI/Hepatic Neg liver ROS, PUD, GERD  ,  Endo/Other    Renal/GU negative Renal ROS     Musculoskeletal  (+) Arthritis ,   Abdominal   Peds  Hematology   Anesthesia Other Findings   Reproductive/Obstetrics                             Anesthesia Physical Anesthesia Plan  ASA: III  Anesthesia Plan: General   Post-op Pain Management:    Induction: Intravenous  PONV Risk Score and Plan: 3 and Ondansetron, Dexamethasone, Midazolam, Propofol infusion and Treatment may vary due to age or medical condition  Airway Management Planned: LMA  Additional Equipment:   Intra-op Plan:   Post-operative Plan: Extubation in OR  Informed Consent: I have reviewed the patients History and Physical, chart, labs and discussed the procedure including the risks, benefits and alternatives for the proposed anesthesia with the patient or authorized representative who has indicated his/her understanding and acceptance.   Dental advisory given  Plan Discussed with: CRNA and Anesthesiologist  Anesthesia Plan Comments:         Anesthesia Quick Evaluation

## 2016-11-12 NOTE — Transfer of Care (Signed)
Immediate Anesthesia Transfer of Care Note  Patient: [name] [name]  Procedure(s) Performed: Procedure(s) with comments: RIGHT BREAST LUMPECTOMY WITH RADIOACTIVE SEED LOCALIZATION (Right) - LMA  Patient Location: PACU  Anesthesia Type:General  Level of Consciousness: awake and alert   Airway & Oxygen Therapy: Patient Spontanous Breathing and Patient connected to face mask oxygen  Post-op Assessment: Report given to RN and Post -op Vital signs reviewed and stable  Post vital signs: Reviewed and stable  Last Vitals:  Vitals:   11/12/16 0742  BP: (!) 153/61  Pulse: 77  Resp: 20  Temp: 36.7 C  SpO2: 99%    Last Pain:  Vitals:   11/12/16 0742  TempSrc: Oral         Complications: No apparent anesthesia complications

## 2016-11-12 NOTE — Op Note (Signed)
Pre-op Diagnosis:  Right breast mass - discordant biopsy Post-op Diagnosis: same Procedure:  Right radioactive seed localized lumpectomy Surgeon:  Bettina Warn K. Anesthesia:  GEN - LMA Indications:  This is a 71 yo female who presents after recent screening mammogram that showed a possible mass in the right breast. Prior to mammogram, she had some left breast tenderness that actually began with last year's mammogram that resulted in significant bruising. There were no left breast findings on mammogram this year. She underwent further work-up that showed a 7 x 5 x 6 mm mass in the right breast at 0900, 5 CMFN. Axilla was normal. Biopsy showed only fibrocystic changes and was felt to be discordant, so the patient was referred for surgical biopsy.  Description of procedure: The patient is brought to the operating room placed in supine position on the operating room table. After an adequate level of general anesthesia was obtained, her right breast was prepped with ChloraPrep and draped sterile fashion. A timeout was taken to ensure the proper patient and proper procedure. We interrogated the breast with the neoprobe. We made a circumareolar incision around the lateral side of the nipple after infiltrating with 0.25% Marcaine. Dissection was carried down into the breast tissue with cautery. We used the neoprobe to guide [place] towards the radioactive seed. We excised an area of tissue around the radioactive seed 2.5 cm in diameter. The specimen was removed and was oriented with a paint kit. Specimen mammogram showed the radioactive seed as well as the biopsy clip within the specimen. This was sent for pathologic examination. There is no residual radioactivity within the biopsy cavity. We inspected carefully for hemostasis. The wound was thoroughly irrigated. I marked the margins of the cavity with clips.The wound was closed with a deep layer of 3-0 Vicryl and a subcuticular layer of 4-0 Monocryl. Benzoin  Steri-Strips were applied. The patient was then extubated and brought to the recovery room in stable condition. All sponge, instrument, and needle counts are correct.  Imogene Burn. Georgette Dover, MD, [facility]  General/ Trauma Surgery  11/12/2016 11:00 AM

## 2016-11-12 NOTE — Anesthesia Postprocedure Evaluation (Signed)
Anesthesia Post Note  Patient: [name] [name]  Procedure(s) Performed: Procedure(s) (LRB): RIGHT BREAST LUMPECTOMY WITH RADIOACTIVE SEED LOCALIZATION (Right)     Patient location during evaluation: PACU Anesthesia Type: General Level of consciousness: awake Pain management: pain level controlled Vital Signs Assessment: post-procedure vital signs reviewed and stable Respiratory status: spontaneous breathing Cardiovascular status: stable Postop Assessment: no signs of nausea or vomiting Anesthetic complications: no    Last Vitals:  Vitals:   11/12/16 1145 11/12/16 1223  BP: 140/69 (!) 147/71  Pulse: 80 74  Resp: 14 16  Temp:  36.4 C  SpO2: 96% 99%    Last Pain:  Vitals:   11/12/16 1223  TempSrc: Oral  PainSc: 2                  [name]

## 2016-11-13 ENCOUNTER — Encounter (HOSPITAL_BASED_OUTPATIENT_CLINIC_OR_DEPARTMENT_OTHER): Payer: Self-pay | Admitting: Surgery

## 2017-09-07 ENCOUNTER — Emergency Department (HOSPITAL_COMMUNITY): Payer: Medicare Other

## 2017-09-07 ENCOUNTER — Other Ambulatory Visit: Payer: Self-pay

## 2017-09-07 ENCOUNTER — Encounter (HOSPITAL_COMMUNITY): Payer: Self-pay | Admitting: *Deleted

## 2017-09-07 ENCOUNTER — Emergency Department (HOSPITAL_COMMUNITY)
Admission: EM | Admit: 2017-09-07 | Discharge: 2017-09-07 | Disposition: A | Discharge status: Home / Self Care | Payer: Medicare Other | Attending: Emergency Medicine | Admitting: Emergency Medicine

## 2017-09-07 DIAGNOSIS — Z79899 Other long term (current) drug therapy: Secondary | ICD-10-CM | POA: Diagnosis not present

## 2017-09-07 DIAGNOSIS — I10 Essential (primary) hypertension: Secondary | ICD-10-CM | POA: Diagnosis not present

## 2017-09-07 DIAGNOSIS — Z7982 Long term (current) use of aspirin: Secondary | ICD-10-CM | POA: Diagnosis not present

## 2017-09-07 DIAGNOSIS — I2510 Atherosclerotic heart disease of native coronary artery without angina pectoris: Secondary | ICD-10-CM | POA: Diagnosis not present

## 2017-09-07 DIAGNOSIS — R1011 Right upper quadrant pain: Secondary | ICD-10-CM | POA: Insufficient documentation

## 2017-09-07 LAB — CBC WITH DIFFERENTIAL/PLATELET
BASOS ABS: 0 10*3/uL (ref 0.0–0.1)
BASOS PCT: 0 %
EOS PCT: 2 %
Eosinophils Absolute: 0.1 10*3/uL (ref 0.0–0.7)
HCT: 39.8 % (ref 36.0–46.0)
Hemoglobin: 12.8 g/dL (ref 12.0–15.0)
Lymphocytes Relative: 25 %
Lymphs Abs: 1.8 10*3/uL (ref 0.7–4.0)
MCH: 28.9 pg (ref 26.0–34.0)
MCHC: 32.2 g/dL (ref 30.0–36.0)
MCV: 89.8 fL (ref 78.0–100.0)
MONO ABS: 0.4 10*3/uL (ref 0.1–1.0)
Monocytes Relative: 6 %
Neutro Abs: 4.8 10*3/uL (ref 1.7–7.7)
Neutrophils Relative %: 67 %
PLATELETS: 230 10*3/uL (ref 150–400)
RBC: 4.43 MIL/uL (ref 3.87–5.11)
RDW: 13.5 % (ref 11.5–15.5)
WBC: 7.1 10*3/uL (ref 4.0–10.5)

## 2017-09-07 LAB — URINALYSIS, ROUTINE W REFLEX MICROSCOPIC
Bacteria, UA: NONE SEEN
Bilirubin Urine: NEGATIVE
GLUCOSE, UA: NEGATIVE mg/dL
Hgb urine dipstick: NEGATIVE
KETONES UR: NEGATIVE mg/dL
Nitrite: NEGATIVE
PH: 7 (ref 5.0–8.0)
Protein, ur: NEGATIVE mg/dL
Specific Gravity, Urine: 1.01 (ref 1.005–1.030)

## 2017-09-07 LAB — COMPREHENSIVE METABOLIC PANEL
ALBUMIN: 3.7 g/dL (ref 3.5–5.0)
ALT: 20 U/L (ref 14–54)
AST: 17 U/L (ref 15–41)
Alkaline Phosphatase: 90 U/L (ref 38–126)
Anion gap: 6 (ref 5–15)
BUN: 16 mg/dL (ref 6–20)
CO2: 26 mmol/L (ref 22–32)
Calcium: 8.9 mg/dL (ref 8.9–10.3)
Chloride: 108 mmol/L (ref 101–111)
Creatinine, Ser: 0.63 mg/dL (ref 0.44–1.00)
GFR calc Af Amer: >60 mL/min (ref >60)
Glucose, Bld: 105 mg/dL — ABNORMAL HIGH (ref 65–99)
POTASSIUM: 4 mmol/L (ref 3.5–5.1)
SODIUM: 140 mmol/L (ref 135–145)
Total Bilirubin: 0.5 mg/dL (ref 0.3–1.2)
Total Protein: 7.2 g/dL (ref 6.5–8.1)

## 2017-09-07 LAB — TROPONIN I: Troponin I: <0.03 ng/mL (ref <0.03)

## 2017-09-07 LAB — LIPASE, BLOOD: LIPASE: 24 U/L (ref 11–51)

## 2017-09-07 MED ORDER — SODIUM CHLORIDE 0.9 % IV BOLUS
1000.0000 mL | Freq: Once | INTRAVENOUS | Status: AC
  Administered 2017-09-07: 1000 mL via INTRAVENOUS

## 2017-09-07 MED ORDER — CEPHALEXIN 250 MG PO CAPS: 250.0000 mg | ORAL_CAPSULE | Freq: Four times a day (QID) | ORAL | 0 refills | Status: DC

## 2017-09-07 MED ORDER — IOPAMIDOL (ISOVUE-300) INJECTION 61%
100.0000 mL | Freq: Once | INTRAVENOUS | Status: AC | PRN
  Administered 2017-09-07: 100 mL via INTRAVENOUS

## 2017-09-07 MED ORDER — HYDROCODONE-ACETAMINOPHEN 5-325 MG PO TABS: 1.0000 | ORAL_TABLET | Freq: Four times a day (QID) | ORAL | 0 refills | Status: DC | PRN

## 2017-09-07 MED ORDER — MORPHINE SULFATE (PF) 4 MG/ML IV SOLN
4.0000 mg | Freq: Once | INTRAVENOUS | Status: AC
  Administered 2017-09-07: 4 mg via INTRAVENOUS
  Filled 2017-09-07: qty 1

## 2017-09-07 NOTE — ED Triage Notes (Signed)
Pt c/o right sided back pain that radiates around to the middle of abdomen x 1 week. Denies n/v/d. Pt saw PCP yesterday and given Bentyl without relief. Denies urinary problems.

## 2017-09-07 NOTE — ED Notes (Signed)
ED Provider at bedside. 

## 2017-09-07 NOTE — ED Notes (Signed)
Patient transported to Ultrasound 
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
## 2017-09-07 NOTE — Discharge Instructions (Addendum)
You were evaluated in the emergency department for 1 week of right lower rib pain radiating into the right upper quadrant.  You had blood work urinalysis CAT scan and ultrasound that did not show an obvious cause of your symptoms.  Your urine is possibly infected and we are starting you on an antibiotic for that.  We are also giving you a prescription for some pain medicine to use if the pain is keeping you up at night.  You will need to contact your primary care doctor for further work-up.  Please return to the emergency department if any worsening symptoms.

## 2017-10-04 ENCOUNTER — Other Ambulatory Visit: Payer: Self-pay | Admitting: Family Medicine

## 2017-10-04 DIAGNOSIS — Z1231 Encounter for screening mammogram for malignant neoplasm of breast: Secondary | ICD-10-CM

## 2017-10-27 ENCOUNTER — Ambulatory Visit
Admission: RE | Admit: 2017-10-27 | Discharge: 2017-10-27 | Disposition: A | Discharge status: Home / Self Care | Payer: Medicare Other | Source: Ambulatory Visit | Attending: Family Medicine | Admitting: Family Medicine

## 2017-10-27 DIAGNOSIS — Z1231 Encounter for screening mammogram for malignant neoplasm of breast: Secondary | ICD-10-CM

## 2017-10-28 ENCOUNTER — Other Ambulatory Visit: Payer: Self-pay | Admitting: Family Medicine

## 2017-10-28 DIAGNOSIS — R928 Other abnormal and inconclusive findings on diagnostic imaging of breast: Secondary | ICD-10-CM

## 2017-11-01 ENCOUNTER — Ambulatory Visit
Admission: RE | Admit: 2017-11-01 | Discharge: 2017-11-01 | Disposition: A | Discharge status: Home / Self Care | Payer: Medicare Other | Source: Ambulatory Visit | Attending: Family Medicine | Admitting: Family Medicine

## 2017-11-01 DIAGNOSIS — R928 Other abnormal and inconclusive findings on diagnostic imaging of breast: Secondary | ICD-10-CM

## 2018-03-09 ENCOUNTER — Encounter: Payer: Self-pay | Admitting: Internal Medicine

## 2018-04-07 ENCOUNTER — Telehealth: Payer: Self-pay | Admitting: Internal Medicine

## 2018-04-07 ENCOUNTER — Ambulatory Visit (INDEPENDENT_AMBULATORY_CARE_PROVIDER_SITE_OTHER): Payer: Medicare Other | Admitting: Nurse Practitioner

## 2018-04-07 ENCOUNTER — Encounter: Payer: Self-pay | Admitting: Nurse Practitioner

## 2018-04-07 VITALS — BP 134/72 | HR 86 | Temp 97.3°F | Ht 60.0 in | Wt 173.8 lb

## 2018-04-07 DIAGNOSIS — R195 Other fecal abnormalities: Secondary | ICD-10-CM | POA: Insufficient documentation

## 2018-04-07 DIAGNOSIS — K5900 Constipation, unspecified: Secondary | ICD-10-CM | POA: Diagnosis not present

## 2018-04-07 DIAGNOSIS — K219 Gastro-esophageal reflux disease without esophagitis: Secondary | ICD-10-CM

## 2018-04-07 LAB — CBC WITH DIFFERENTIAL/PLATELET
Absolute Monocytes: 447 cells/uL (ref 200–950)
BASOS PCT: 0.4 %
Basophils Absolute: 28 cells/uL (ref 0–200)
EOS PCT: 2 %
Eosinophils Absolute: 142 cells/uL (ref 15–500)
HCT: 40.8 % (ref 35.0–45.0)
HEMOGLOBIN: 13.6 g/dL (ref 11.7–15.5)
Lymphs Abs: 1747 cells/uL (ref 850–3900)
MCH: 29.2 pg (ref 27.0–33.0)
MCHC: 33.3 g/dL (ref 32.0–36.0)
MCV: 87.6 fL (ref 80.0–100.0)
MPV: 9.7 fL (ref 7.5–12.5)
Monocytes Relative: 6.3 %
NEUTROS ABS: 4736 {cells}/uL (ref 1500–7800)
Neutrophils Relative %: 66.7 %
Platelets: 262 10*3/uL (ref 140–400)
RBC: 4.66 10*6/uL (ref 3.80–5.10)
RDW: 12.7 % (ref 11.0–15.0)
TOTAL LYMPHOCYTE: 24.6 %
WBC: 7.1 10*3/uL (ref 3.8–10.8)

## 2018-04-07 NOTE — Assessment & Plan Note (Signed)
Intermittent/rare dark stools.  She does have a remote history of upper GI bleed and she is not on any PPI.  I will check a CBC today to ensure no significant bleeding.  Return for follow-up in 4 months.  Further recommendations to be based on lab results.

## 2018-04-07 NOTE — Patient Instructions (Addendum)
Your health issues we discussed today were:   Dark stools: 1. As we discussed many things can cause dark stools, including bleeding 2. We will check your blood counts to ensure you are not having significant bleeding  Heartburn (GERD): 1. I will start you on Protonix (pantoprazole) 40 mg once a day on an empty stomach. 2. I am printing heartburn education information below  "Constipation": 1. While you are not significantly constipated you are likely not drinking enough water or eating enough fiber.  This causes you to have multiple bowel movements a day and feeling like you have not emptied completely 2. Increase the amount of water you drink daily 3. Start a daily fiber supplement.  There are multiple options available over-the-counter from the pharmacy  Overall I recommend:  1. Return for follow-up in 3 months 2. Call us if you have any questions or concerns.  At [facility] we value your feedback. You may receive a survey about your visit today. Please share your experience as we strive to create trusting relationships with our patients to provide genuine, compassionate, quality care.  We appreciate your understanding and patience as we review any laboratory studies, imaging, and other diagnostic tests that are ordered as we care for you. Our office policy is 5 business days for review of these results, and any emergent or urgent results are addressed in a timely manner for your best interest. If you do not hear from our office in 1 week, please contact us.   We also encourage the use of MyChart, which contains your medical information for your review as well. If you are not enrolled in this feature, an access code is on this after visit summary for your convenience. Thank you for allowing [place] to be involved in your care.  It was great to see you today!  I hope you have a great day!!       Food Choices for Gastroesophageal Reflux Disease, Adult When you have  gastroesophageal reflux disease (GERD), the foods you eat and your eating habits are very important. Choosing the right foods can help ease the discomfort of GERD. Consider working with a diet and nutrition specialist (dietitian) to help you make healthy food choices. What general guidelines should I follow?  Eating plan  Choose healthy foods low in fat, such as fruits, vegetables, whole grains, low-fat dairy products, and lean meat, fish, and poultry.  Eat frequent, small meals instead of three large meals each day. Eat your meals slowly, in a relaxed setting. Avoid bending over or lying down until 2-3 hours after eating.  Limit high-fat foods such as fatty meats or fried foods.  Limit your intake of oils, butter, and shortening to less than 8 teaspoons each day.  Avoid the following: ? Foods that cause symptoms. These may be different for different people. Keep a food diary to keep track of foods that cause symptoms. ? Alcohol. ? Drinking large amounts of liquid with meals. ? Eating meals during the 2-3 hours before bed.  Cook foods using methods other than frying. This may include baking, grilling, or broiling. Lifestyle  Maintain a healthy weight. Ask your health care provider what weight is healthy for you. If you need to lose weight, work with your health care provider to do so safely.  Exercise for at least 30 minutes on 5 or more days each week, or as told by your health care provider.  Avoid wearing clothes that fit tightly around your waist and chest.  Do not use any products that contain nicotine or tobacco, such as cigarettes and e-cigarettes. If you need help quitting, ask your health care provider.  Sleep with the head of your bed raised. Use a wedge under the mattress or blocks under the bed frame to raise the head of the bed. What foods are not recommended? The items listed may not be a complete list. Talk with your dietitian about what dietary choices are best for  you. Grains Pastries or quick breads with added fat. [place] toast. Vegetables Deep fried vegetables. [place] fries. Any vegetables prepared with added fat. Any vegetables that cause symptoms. For some people this may include tomatoes and tomato products, chili peppers, onions and garlic, and horseradish. Fruits Any fruits prepared with added fat. Any fruits that cause symptoms. For some people this may include citrus fruits, such as oranges, grapefruit, pineapple, and lemons. Meats and other protein foods High-fat meats, such as fatty beef or pork, hot dogs, ribs, ham, sausage, salami and bacon. Fried meat or protein, including fried fish and fried chicken. Nuts and nut butters. Dairy Whole milk and chocolate milk. Sour cream. Cream. Ice cream. Cream cheese. Milk shakes. Beverages Coffee and tea, with or without caffeine. Carbonated beverages. Sodas. Energy drinks. Fruit juice made with acidic fruits (such as orange or grapefruit). Tomato juice. Alcoholic drinks. Fats and oils Butter. Margarine. Shortening. Ghee. Sweets and desserts Chocolate and cocoa. Donuts. Seasoning and other foods Pepper. Peppermint and spearmint. Any condiments, herbs, or seasonings that cause symptoms. For some people, this may include curry, hot sauce, or vinegar-based salad dressings. Summary  When you have gastroesophageal reflux disease (GERD), food and lifestyle choices are very important to help ease the discomfort of GERD.  Eat frequent, small meals instead of three large meals each day. Eat your meals slowly, in a relaxed setting. Avoid bending over or lying down until 2-3 hours after eating.  Limit high-fat foods such as fatty meat or fried foods. This information is not intended to replace advice given to you by your health care provider. Make sure you discuss any questions you have with your health care provider. Document Released: 03/09/2005 Document Revised: 03/10/2016 Document Reviewed:  03/10/2016 Elsevier Interactive Patient Education  2019 [employer].
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
## 2018-04-07 NOTE — Telephone Encounter (Signed)
EG spoke with pharmacist and medication will be filled for pt.

## 2018-04-07 NOTE — Telephone Encounter (Signed)
Mardelle Matte from [facility] called to say that patient was there to pick up a prescription and he hasn't received anything from [place]. I told him the patient just left the office and the provider probably hasn't gotten to it yet and it may be later today. Patient said it was protonix.

## 2018-04-07 NOTE — Assessment & Plan Note (Signed)
Mild "constipation".  While she is not having hard stools or straining she does have multiple [place] 4 stools a day.  She readily agrees that after a bowel movement she does not feel like she has emptied completely and often has to return multiple times later in the day.  Minimal fiber and water intake.  She does have some flatulence and lower abdominal discomfort.  At this point I will have her increase her water intake.  Start a fiber supplement 1-2 times a day.  Return for follow-up in 3 months.

## 2018-04-07 NOTE — Assessment & Plan Note (Signed)
The patient notes significant reflux.  Not on NSAIDs, occasional black stools.  Not on any antacids.  Significant belching and bloating.  Likely poorly controlled reflux.  I will start her on Protonix 40 mg daily.  Return for follow-up in 3 months.

## 2018-04-11 NOTE — Progress Notes (Signed)
CC'D TO PCP °

## 2018-05-11 ENCOUNTER — Encounter: Payer: Self-pay | Admitting: Nurse Practitioner

## 2018-07-04 ENCOUNTER — Ambulatory Visit: Payer: Medicare Other | Admitting: Gastroenterology

## 2018-07-07 ENCOUNTER — Ambulatory Visit: Payer: Medicare Other | Admitting: Nurse Practitioner

## 2018-08-24 IMAGING — MG 2D DIGITAL SCREENING BILATERAL MAMMOGRAM WITH CAD AND ADJUNCT TO
9 of 12 series · 9 of 28 positions shown · non-contrast
Comparison: Previous exam(s).

CLINICAL DATA: Screening.

EXAM:
2D DIGITAL SCREENING BILATERAL MAMMOGRAM WITH CAD AND ADJUNCT TOMO

[L CC]
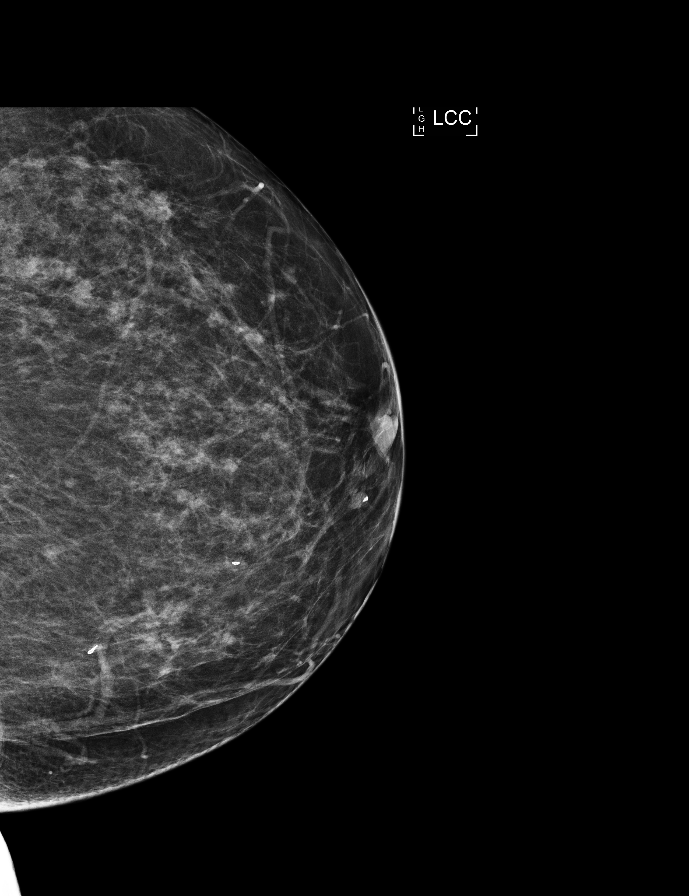

[L MLO]
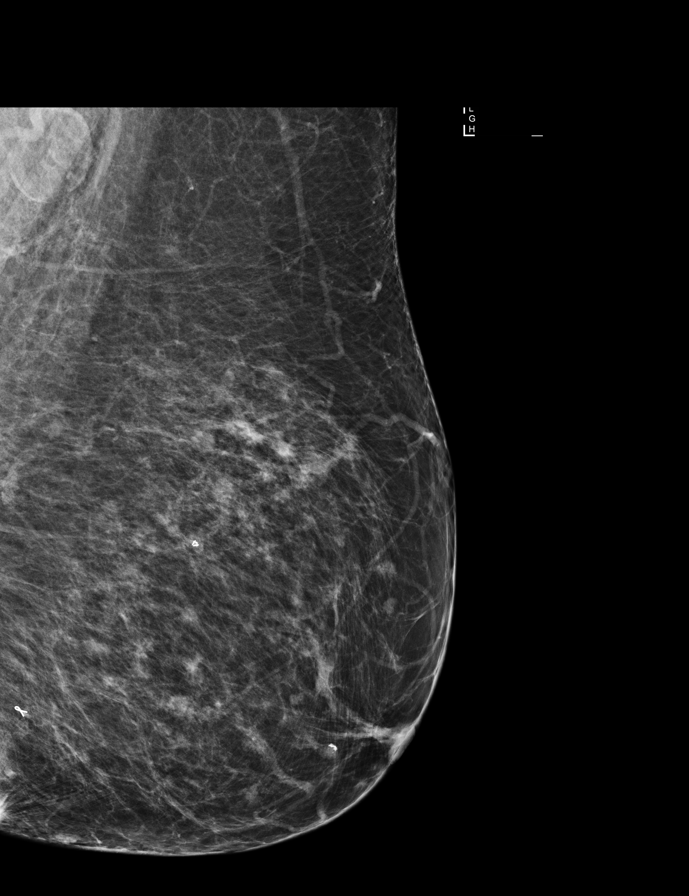

[R CC]
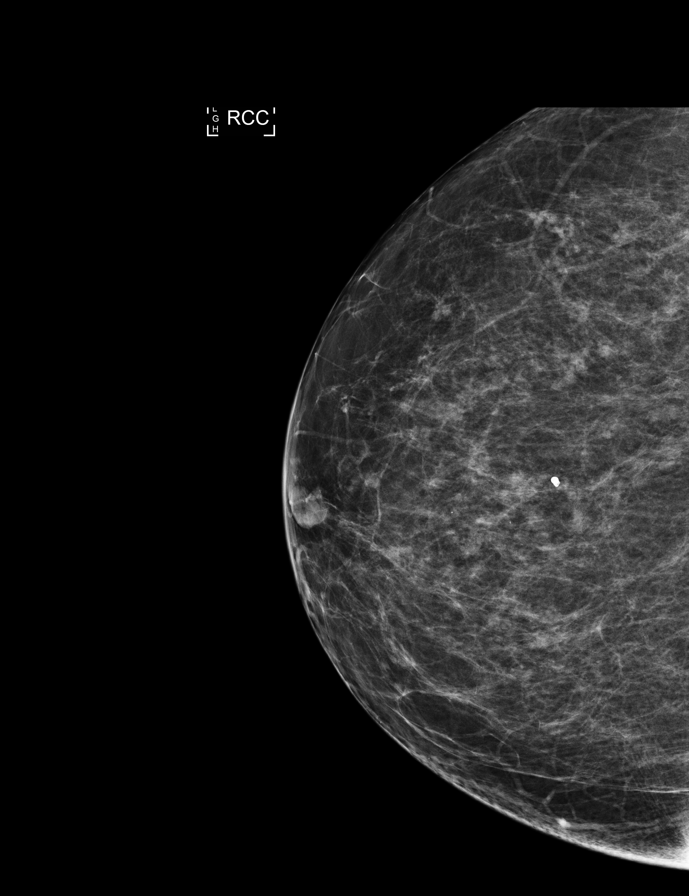

[L CC synth-2D]
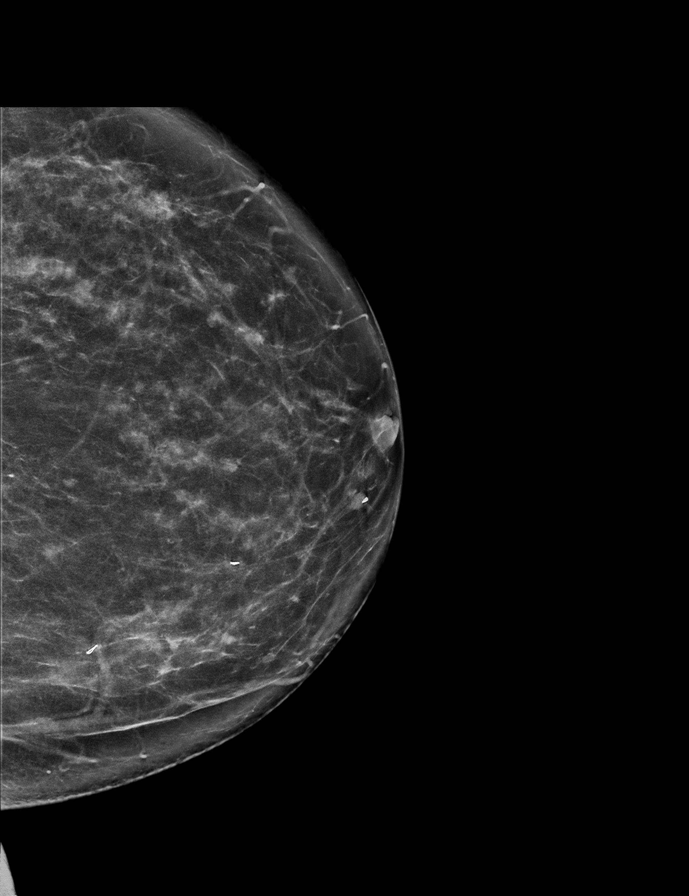

[R MLO]
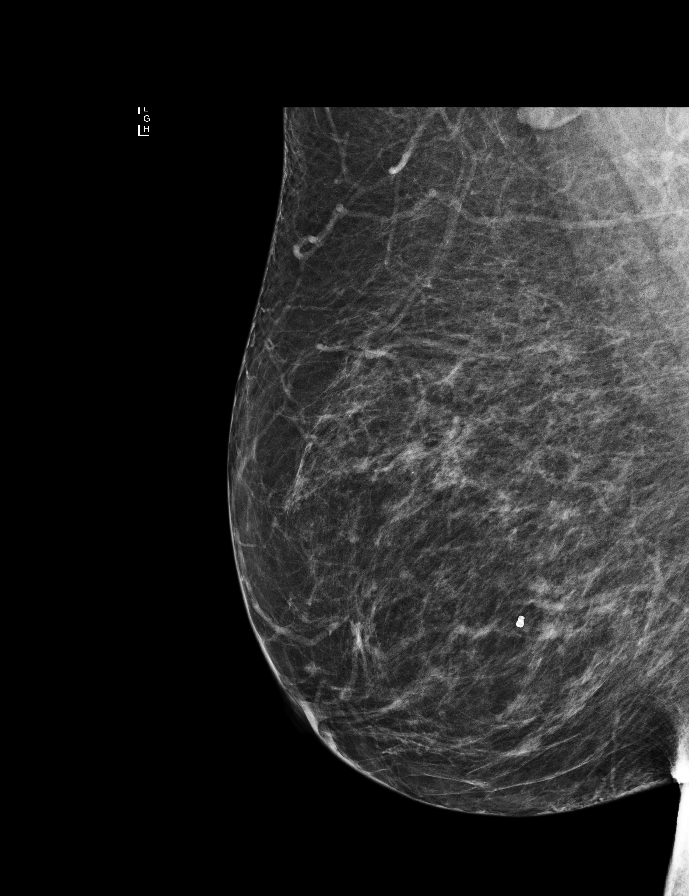

[L MLO synth-2D]
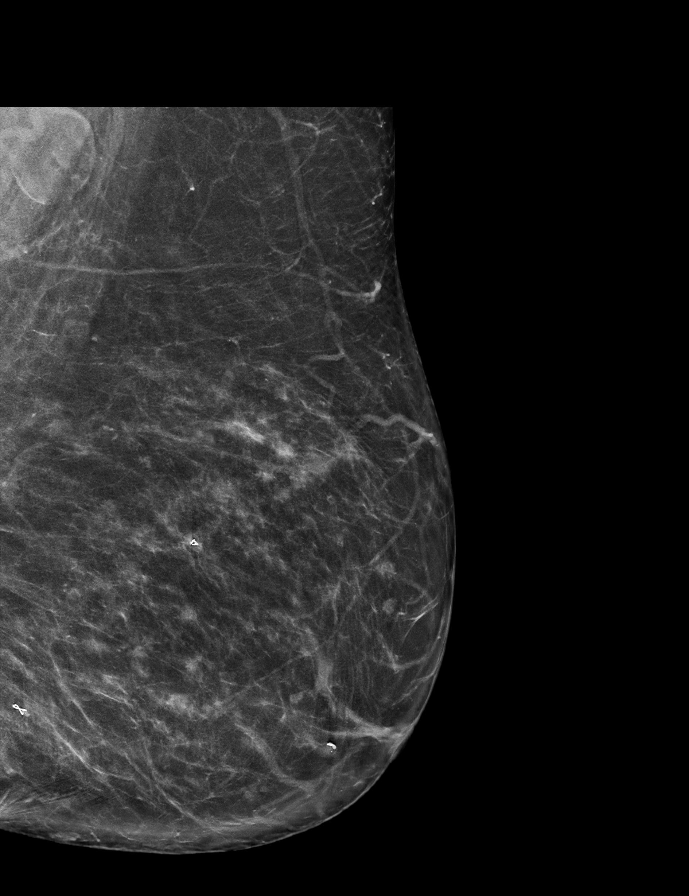

[R CC synth-2D]
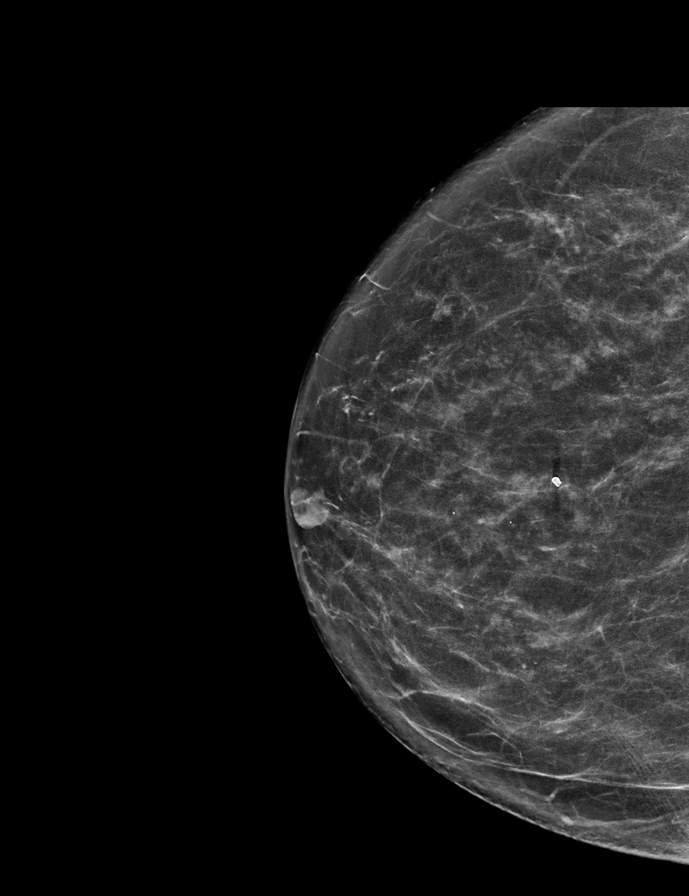

[R MLO synth-2D]
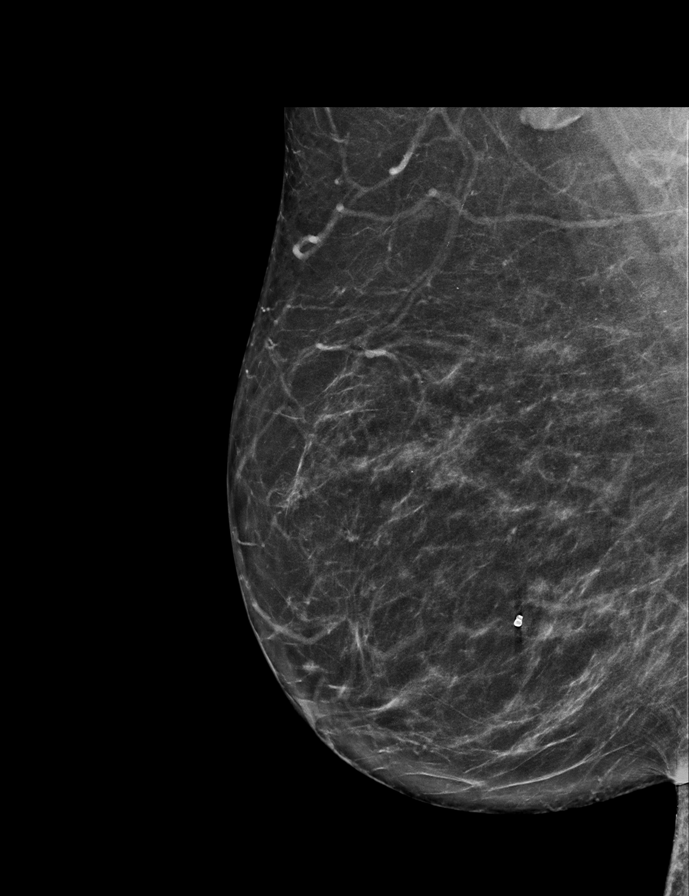

[R CC tomo · tomo slice 34/67.0]
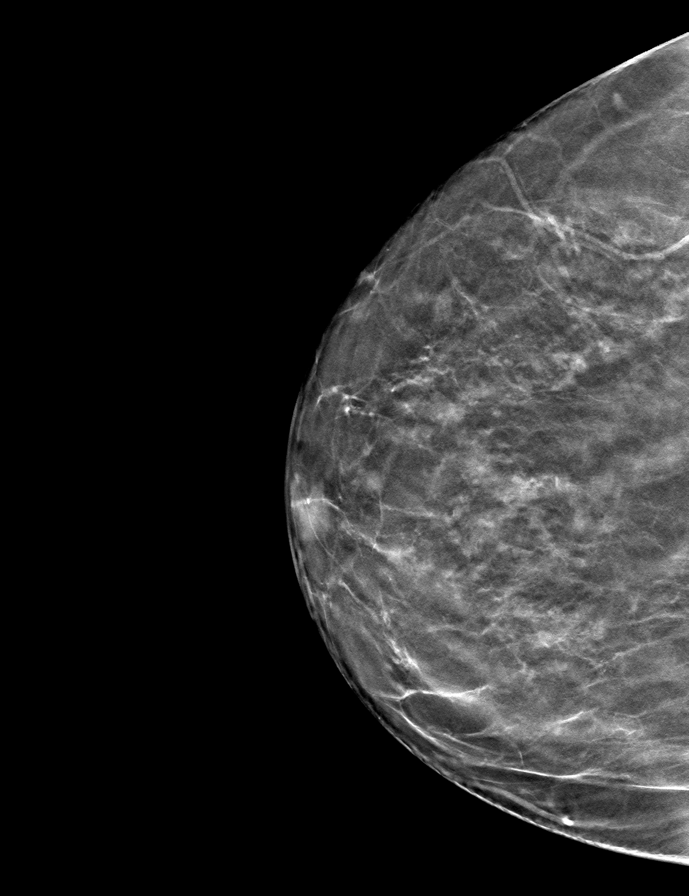

[9 of 28 positions shown; findings below may reference images not displayed]

ACR Breast Density Category b: There are scattered areas of
fibroglandular density.
FINDINGS: In the right breast, a possible mass warrants further evaluation. In
the left breast, no findings suspicious for malignancy. Images were
processed with CAD.
IMPRESSION: Further evaluation is suggested for possible mass in the right
breast.

RECOMMENDATION:
Diagnostic mammogram and possibly ultrasound of the right breast.
(Code:[id number])

The patient will be contacted regarding the findings, and additional
imaging will be scheduled.

BI-RADS CATEGORY  0: Incomplete. Need additional imaging evaluation
and/or prior mammograms for comparison.

## 2018-10-12 ENCOUNTER — Other Ambulatory Visit: Payer: Self-pay | Admitting: Family Medicine

## 2018-10-12 DIAGNOSIS — Z1231 Encounter for screening mammogram for malignant neoplasm of breast: Secondary | ICD-10-CM

## 2018-11-02 ENCOUNTER — Other Ambulatory Visit: Payer: Self-pay | Admitting: Nurse Practitioner

## 2018-11-29 ENCOUNTER — Ambulatory Visit: Payer: Medicare Other

## 2019-02-07 ENCOUNTER — Ambulatory Visit: Payer: Medicare Other

## 2019-09-12 ENCOUNTER — Other Ambulatory Visit: Payer: Self-pay | Admitting: Family Medicine

## 2019-09-13 ENCOUNTER — Other Ambulatory Visit: Payer: Self-pay | Admitting: Family Medicine

## 2019-09-13 DIAGNOSIS — Z1231 Encounter for screening mammogram for malignant neoplasm of breast: Secondary | ICD-10-CM

## 2019-09-19 ENCOUNTER — Other Ambulatory Visit: Payer: Self-pay | Admitting: Family Medicine

## 2019-09-19 DIAGNOSIS — Z1231 Encounter for screening mammogram for malignant neoplasm of breast: Secondary | ICD-10-CM

## 2019-09-26 ENCOUNTER — Other Ambulatory Visit: Payer: Self-pay | Admitting: Family Medicine

## 2019-09-26 DIAGNOSIS — N644 Mastodynia: Secondary | ICD-10-CM

## 2019-10-12 ENCOUNTER — Other Ambulatory Visit: Payer: Self-pay

## 2019-10-12 ENCOUNTER — Ambulatory Visit
Admission: RE | Admit: 2019-10-12 | Discharge: 2019-10-12 | Disposition: A | Discharge status: Home / Self Care | Payer: Medicare Other | Source: Ambulatory Visit | Attending: Family Medicine | Admitting: Family Medicine

## 2019-10-12 ENCOUNTER — Other Ambulatory Visit: Payer: Self-pay | Admitting: Family Medicine

## 2019-10-12 ENCOUNTER — Other Ambulatory Visit: Payer: Medicare Other

## 2019-10-12 DIAGNOSIS — N644 Mastodynia: Secondary | ICD-10-CM

## 2019-10-12 DIAGNOSIS — N6489 Other specified disorders of breast: Secondary | ICD-10-CM

## 2020-02-14 ENCOUNTER — Ambulatory Visit (INDEPENDENT_AMBULATORY_CARE_PROVIDER_SITE_OTHER): Payer: Medicare Other | Admitting: Urology

## 2020-02-14 ENCOUNTER — Encounter: Payer: Self-pay | Admitting: Urology

## 2020-02-14 ENCOUNTER — Other Ambulatory Visit: Payer: Self-pay

## 2020-02-14 DIAGNOSIS — N3941 Urge incontinence: Secondary | ICD-10-CM

## 2020-02-14 LAB — BLADDER SCAN AMB NON-IMAGING: Scan Result: 39

## 2020-02-14 MED ORDER — SOLIFENACIN SUCCINATE 5 MG PO TABS: 5.0000 mg | ORAL_TABLET | Freq: Every day | ORAL | 11 refills | Status: DC
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
## 2020-02-14 NOTE — Patient Instructions (Signed)

## 2020-02-20 LAB — URINALYSIS, COMPLETE
Bilirubin, UA: NEGATIVE
Glucose, UA: NEGATIVE
Ketones, UA: NEGATIVE
Leukocytes,UA: NEGATIVE
Nitrite, UA: NEGATIVE
Protein,UA: NEGATIVE
RBC, UA: NEGATIVE
Specific Gravity, UA: 1.01 (ref 1.005–1.030)
Urobilinogen, Ur: 0.2 mg/dL (ref 0.2–1.0)
pH, UA: 6 (ref 5.0–7.5)

## 2020-02-20 LAB — MICROSCOPIC EXAMINATION
Bacteria, UA: NONE SEEN
RBC: NONE SEEN /hpf (ref 0–2)

## 2020-09-17 ENCOUNTER — Other Ambulatory Visit: Payer: Self-pay

## 2020-09-17 ENCOUNTER — Other Ambulatory Visit: Payer: Self-pay | Admitting: Family Medicine

## 2020-09-17 DIAGNOSIS — Z1231 Encounter for screening mammogram for malignant neoplasm of breast: Secondary | ICD-10-CM

## 2020-11-08 ENCOUNTER — Ambulatory Visit
Admission: RE | Admit: 2020-11-08 | Discharge: 2020-11-08 | Disposition: A | Discharge status: Home / Self Care | Payer: Medicare Other | Source: Ambulatory Visit | Attending: Family Medicine | Admitting: Family Medicine

## 2020-11-08 ENCOUNTER — Other Ambulatory Visit: Payer: Self-pay

## 2020-11-08 DIAGNOSIS — Z1231 Encounter for screening mammogram for malignant neoplasm of breast: Secondary | ICD-10-CM

## 2020-11-12 ENCOUNTER — Other Ambulatory Visit: Payer: Self-pay | Admitting: Family Medicine

## 2020-11-12 DIAGNOSIS — R928 Other abnormal and inconclusive findings on diagnostic imaging of breast: Secondary | ICD-10-CM

## 2020-11-21 ENCOUNTER — Other Ambulatory Visit: Payer: Medicare Other

## 2020-11-27 ENCOUNTER — Other Ambulatory Visit: Payer: Self-pay | Admitting: Family Medicine

## 2020-11-27 ENCOUNTER — Other Ambulatory Visit: Payer: Self-pay

## 2020-11-27 ENCOUNTER — Ambulatory Visit
Admission: RE | Admit: 2020-11-27 | Discharge: 2020-11-27 | Disposition: A | Discharge status: Home / Self Care | Payer: Medicare Other | Source: Ambulatory Visit | Attending: Family Medicine | Admitting: Family Medicine

## 2020-11-27 DIAGNOSIS — N6489 Other specified disorders of breast: Secondary | ICD-10-CM

## 2020-11-27 DIAGNOSIS — R928 Other abnormal and inconclusive findings on diagnostic imaging of breast: Secondary | ICD-10-CM

## 2020-12-05 ENCOUNTER — Ambulatory Visit
Admission: RE | Admit: 2020-12-05 | Discharge: 2020-12-05 | Disposition: A | Discharge status: Home / Self Care | Payer: Medicare Other | Source: Ambulatory Visit | Attending: Family Medicine | Admitting: Family Medicine

## 2020-12-05 ENCOUNTER — Other Ambulatory Visit: Payer: Self-pay

## 2020-12-05 DIAGNOSIS — N6489 Other specified disorders of breast: Secondary | ICD-10-CM

## 2020-12-23 ENCOUNTER — Ambulatory Visit: Payer: Self-pay | Admitting: Surgery

## 2020-12-23 ENCOUNTER — Other Ambulatory Visit: Payer: Self-pay | Admitting: Surgery

## 2020-12-23 DIAGNOSIS — R928 Other abnormal and inconclusive findings on diagnostic imaging of breast: Secondary | ICD-10-CM

## 2020-12-23 DIAGNOSIS — N6320 Unspecified lump in the left breast, unspecified quadrant: Secondary | ICD-10-CM

## 2021-12-25 ENCOUNTER — Other Ambulatory Visit: Payer: Self-pay | Admitting: Surgery

## 2021-12-25 ENCOUNTER — Other Ambulatory Visit: Payer: Self-pay | Admitting: Family Medicine

## 2021-12-25 DIAGNOSIS — N6320 Unspecified lump in the left breast, unspecified quadrant: Secondary | ICD-10-CM

## 2021-12-25 DIAGNOSIS — R928 Other abnormal and inconclusive findings on diagnostic imaging of breast: Secondary | ICD-10-CM

## 2022-01-07 ENCOUNTER — Ambulatory Visit
Admission: RE | Admit: 2022-01-07 | Discharge: 2022-01-07 | Disposition: A | Discharge status: Home / Self Care | Payer: Medicare Other | Source: Ambulatory Visit | Attending: Surgery | Admitting: Surgery

## 2022-01-07 DIAGNOSIS — N6320 Unspecified lump in the left breast, unspecified quadrant: Secondary | ICD-10-CM

## 2022-01-07 DIAGNOSIS — R928 Other abnormal and inconclusive findings on diagnostic imaging of breast: Secondary | ICD-10-CM

## 2022-01-08 NOTE — Progress Notes (Signed)
I need to see her soon to discuss surgery to remove this area

## 2022-01-16 ENCOUNTER — Ambulatory Visit: Payer: Self-pay | Admitting: Surgery

## 2022-01-16 DIAGNOSIS — N6320 Unspecified lump in the left breast, unspecified quadrant: Secondary | ICD-10-CM

## 2022-01-16 NOTE — H&P (View-Only) (Signed)
Subjective    Chief Complaint: Follow-up       History of Present Illness: [name] [name] is a 76 y.o. female who is seen today for breast mass.   This is a 76 year old female who presented in October 2022 after a routine screening mammogram that revealed an area of distortion in the left breast US showed a vague mass at 10:00 4 cmfn.  This was biopsied and revealed only usual ductal hyperplasia, which was felt to be discordant.  She developed a large hematoma in the biopsy site, which has displaced the biopsy clip from the area of distortion.  The biopsy was performed on 12/05/20.   In 2019, the patient had a right radioactive seed localized lumpectomy for a separate discordant biopsy.  Lumpectomy at that time revealed a radial scar but no sign of malignancy.   We recommended 81-month follow-up imaging after resolution of the hematoma.  Unfortunately, the patient did not have any further imaging until her annual mammogram on 01/07/2022.  She continues to have an area of distortion in the left breast in the retroareolar region at 6:00.  She presents now to discuss excision.  The patient has noticed some pain radiating from her sternum towards her left breast.  This is mostly in the lower part of her breast.  This has been present for several months.   Review of Systems: A complete review of systems was obtained from the patient.  I have reviewed this information and discussed as appropriate with the patient.  See HPI as well for other ROS.   Review of Systems  Constitutional: Negative.   HENT: Negative.    Eyes: Negative.   Respiratory: Negative.    Cardiovascular: Negative.   Gastrointestinal: Negative.   Genitourinary: Negative.   Musculoskeletal: Negative.   Skin: Negative.   Neurological: Negative.   Endo/Heme/Allergies: Negative.   Psychiatric/Behavioral: Negative.          Medical History: Past Medical History      Past Medical History:  Diagnosis Date    Hypertension     Sleep apnea             Patient Active Problem List  Diagnosis   Anxiety state, unspecified   CAD (coronary artery disease)   Depression   Esophageal reflux   Hyperlipidemia   Melena   Peptic ulcer   Rectal bleeding   Syncope   Hypertension   Vertigo   Subareolar mass of left breast      Past Surgical History       Past Surgical History:  Procedure Laterality Date   ABDOMINAL AORTIC ANEURYSM REPAIR       MASTECTOMY PARTIAL / LUMPECTOMY        2018        Allergies       Allergies  Allergen Reactions   Aspirin Hives   Codeine Unknown      REACTION: Hallucinations   Penicillins Unknown      REACTION: Rash              Current Outpatient Medications on File Prior to Visit  Medication Sig Dispense Refill   amLODIPine (NORVASC) 2.5 MG tablet Take 2.5 mg by mouth once daily       aspirin 81 MG EC tablet Take 81 mg by mouth once daily       atorvastatin (LIPITOR) 20 MG tablet Take 20 mg by mouth once daily       zolpidem (AMBIEN) 10 mg  tablet Take 10 mg by mouth at bedtime        No current facility-administered medications on file prior to visit.      Family History  Family History  Family history unknown: Yes        Social History       Tobacco Use  Smoking Status Never  Smokeless Tobacco Never      Social History  Social History        Socioeconomic History   Marital status: Married  Tobacco Use   Smoking status: Never   Smokeless tobacco: Never  Vaping Use   Vaping Use: Never used  Substance and Sexual Activity   Alcohol use: Never   Drug use: Never   Sexual activity: Defer        Objective:      Physical Exam    Constitutional:  WDWN in NAD, conversant, no obvious deformities; lying in bed comfortably Eyes:  Pupils equal, round; sclera anicteric; moist conjunctiva; no lid lag HENT:  Oral mucosa moist; good dentition  Neck:  No masses palpated, trachea midline; no thyromegaly Lungs:  CTA bilaterally; normal  respiratory effort Breasts:  symmetric; bilateral fibrocystic changes in the lower breasts; no palpable masses; no axillary lymphadenopathy; no nipple changes CV:  Regular rate and rhythm; no murmurs; extremities well-perfused with no edema Abd:  +bowel sounds, soft, non-tender, no palpable organomegaly; no palpable hernias Musc:  Normal gait; no apparent clubbing or cyanosis in extremities Lymphatic:  No palpable cervical or axillary lymphadenopathy Skin:  Warm, dry; no sign of jaundice Psychiatric - alert and oriented x 4; calm mood and affect       Labs, Imaging and Diagnostic Testing:   CLINICAL DATA:  Patient was found have a left breast architectural distortion on diagnostic imaging performed on 11/27/2020. She subsequently underwent stereotactic core needle biopsy, pathology revealing ductal epithelial hyperplasia, usual type. No atypia or malignancy. This was felt to be discordant. She had a significant post procedure hematoma. On surgical follow-up, it was decided to not excised area due to hematoma, but to reassess with imaging. This is the patient's first follow-up imaging since the biopsy in September 2022. She is complaining of diffuse left breast pain.   EXAM: DIGITAL DIAGNOSTIC BILATERAL MAMMOGRAM WITH TOMOSYNTHESIS   TECHNIQUE: Bilateral digital diagnostic mammography and breast tomosynthesis was performed.   COMPARISON:  Previous exam(s).   ACR Breast Density Category b: There are scattered areas of fibroglandular density.   FINDINGS: The X shaped post biopsy marker clip lies along the posterior margin of the subtle distortion, which is best appreciated on the cc view. The post procedure hematoma has mostly resolved.   There are no suspicious breast masses, areas of significant asymmetry, new areas of distortion or suspicious calcifications.   IMPRESSION: 1. Subtle area of distortion in the left breast, 6 o'clock retroareolar, with the post biopsy, X  shaped marker clip lying along its posterior margin. Surgical excision is again recommended, since the pathology from the prior biopsy was felt to be discordant.   RECOMMENDATION: 1. Surgical follow-up. Excision again recommended. However, if excision is not performed, recommend diagnostic follow-up imaging of the left breast in 1 year to assess for stability.   I have discussed the findings and recommendations with the patient. If applicable, a reminder letter will be sent to the patient regarding the next appointment.   BI-RADS CATEGORY  4: Suspicious.     Electronically Signed   By: Renard Hamper.D.  On: 01/07/2022 11:06     Assessment and Plan:  Diagnoses and all orders for this visit:   Subareolar mass of left breast       Recommend left breast radioactive seed localized excisional biopsy.  The surgical procedure has been discussed with the patient.  Potential risks, benefits, alternative treatments, and expected outcomes have been explained.  All of the patient's questions at this time have been answered.  The likelihood of reaching the patient's treatment goal is good.  The patient understand the proposed surgical procedure and wishes to proceed.     Rabab Currington Jearld Adjutant, MD    01/16/2022 10:24 AM

## 2022-01-16 NOTE — H&P (Signed)
Subjective    Chief Complaint: Follow-up       History of Present Illness: [name] [name] is a 76 y.o. female who is seen today for breast mass.   This is a 76 year old female who presented in October 2022 after a routine screening mammogram that revealed an area of distortion in the left breast US showed a vague mass at 10:00 4 cmfn.  This was biopsied and revealed only usual ductal hyperplasia, which was felt to be discordant.  She developed a large hematoma in the biopsy site, which has displaced the biopsy clip from the area of distortion.  The biopsy was performed on 12/05/20.   In 2019, the patient had a right radioactive seed localized lumpectomy for a separate discordant biopsy.  Lumpectomy at that time revealed a radial scar but no sign of malignancy.   We recommended 6-month follow-up imaging after resolution of the hematoma.  Unfortunately, the patient did not have any further imaging until her annual mammogram on 01/07/2022.  She continues to have an area of distortion in the left breast in the retroareolar region at 6:00.  She presents now to discuss excision.  The patient has noticed some pain radiating from her sternum towards her left breast.  This is mostly in the lower part of her breast.  This has been present for several months.   Review of Systems: A complete review of systems was obtained from the patient.  I have reviewed this information and discussed as appropriate with the patient.  See HPI as well for other ROS.   Review of Systems  Constitutional: Negative.   HENT: Negative.    Eyes: Negative.   Respiratory: Negative.    Cardiovascular: Negative.   Gastrointestinal: Negative.   Genitourinary: Negative.   Musculoskeletal: Negative.   Skin: Negative.   Neurological: Negative.   Endo/Heme/Allergies: Negative.   Psychiatric/Behavioral: Negative.          Medical History: Past Medical History      Past Medical History:  Diagnosis Date    Hypertension     Sleep apnea             Patient Active Problem List  Diagnosis   Anxiety state, unspecified   CAD (coronary artery disease)   Depression   Esophageal reflux   Hyperlipidemia   Melena   Peptic ulcer   Rectal bleeding   Syncope   Hypertension   Vertigo   Subareolar mass of left breast      Past Surgical History       Past Surgical History:  Procedure Laterality Date   ABDOMINAL AORTIC ANEURYSM REPAIR       MASTECTOMY PARTIAL / LUMPECTOMY        2018        Allergies       Allergies  Allergen Reactions   Aspirin Hives   Codeine Unknown      REACTION: Hallucinations   Penicillins Unknown      REACTION: Rash              Current Outpatient Medications on File Prior to Visit  Medication Sig Dispense Refill   amLODIPine (NORVASC) 2.5 MG tablet Take 2.5 mg by mouth once daily       aspirin 81 MG EC tablet Take 81 mg by mouth once daily       atorvastatin (LIPITOR) 20 MG tablet Take 20 mg by mouth once daily       zolpidem (AMBIEN) 10 mg   tablet Take 10 mg by mouth at bedtime        No current facility-administered medications on file prior to visit.      Family History  Family History  Family history unknown: Yes        Social History       Tobacco Use  Smoking Status Never  Smokeless Tobacco Never      Social History  Social History        Socioeconomic History   Marital status: Married  Tobacco Use   Smoking status: Never   Smokeless tobacco: Never  Vaping Use   Vaping Use: Never used  Substance and Sexual Activity   Alcohol use: Never   Drug use: Never   Sexual activity: Defer        Objective:      Physical Exam    Constitutional:  WDWN in NAD, conversant, no obvious deformities; lying in bed comfortably Eyes:  Pupils equal, round; sclera anicteric; moist conjunctiva; no lid lag HENT:  Oral mucosa moist; good dentition  Neck:  No masses palpated, trachea midline; no thyromegaly Lungs:  CTA bilaterally; normal  respiratory effort Breasts:  symmetric; bilateral fibrocystic changes in the lower breasts; no palpable masses; no axillary lymphadenopathy; no nipple changes CV:  Regular rate and rhythm; no murmurs; extremities well-perfused with no edema Abd:  +bowel sounds, soft, non-tender, no palpable organomegaly; no palpable hernias Musc:  Normal gait; no apparent clubbing or cyanosis in extremities Lymphatic:  No palpable cervical or axillary lymphadenopathy Skin:  Warm, dry; no sign of jaundice Psychiatric - alert and oriented x 4; calm mood and affect       Labs, Imaging and Diagnostic Testing:   CLINICAL DATA:  Patient was found have a left breast architectural distortion on diagnostic imaging performed on 11/27/2020. She subsequently underwent stereotactic core needle biopsy, pathology revealing ductal epithelial hyperplasia, usual type. No atypia or malignancy. This was felt to be discordant. She had a significant post procedure hematoma. On surgical follow-up, it was decided to not excised area due to hematoma, but to reassess with imaging. This is the patient's first follow-up imaging since the biopsy in September 2022. She is complaining of diffuse left breast pain.   EXAM: DIGITAL DIAGNOSTIC BILATERAL MAMMOGRAM WITH TOMOSYNTHESIS   TECHNIQUE: Bilateral digital diagnostic mammography and breast tomosynthesis was performed.   COMPARISON:  Previous exam(s).   ACR Breast Density Category b: There are scattered areas of fibroglandular density.   FINDINGS: The X shaped post biopsy marker clip lies along the posterior margin of the subtle distortion, which is best appreciated on the cc view. The post procedure hematoma has mostly resolved.   There are no suspicious breast masses, areas of significant asymmetry, new areas of distortion or suspicious calcifications.   IMPRESSION: 1. Subtle area of distortion in the left breast, 6 o'clock retroareolar, with the post biopsy, X  shaped marker clip lying along its posterior margin. Surgical excision is again recommended, since the pathology from the prior biopsy was felt to be discordant.   RECOMMENDATION: 1. Surgical follow-up. Excision again recommended. However, if excision is not performed, recommend diagnostic follow-up imaging of the left breast in 1 year to assess for stability.   I have discussed the findings and recommendations with the patient. If applicable, a reminder letter will be sent to the patient regarding the next appointment.   BI-RADS CATEGORY  4: Suspicious.     Electronically Signed   By: David  Ormond M.D.     On: 01/07/2022 11:06     Assessment and Plan:  Diagnoses and all orders for this visit:   Subareolar mass of left breast       Recommend left breast radioactive seed localized excisional biopsy.  The surgical procedure has been discussed with the patient.  Potential risks, benefits, alternative treatments, and expected outcomes have been explained.  All of the patient's questions at this time have been answered.  The likelihood of reaching the patient's treatment goal is good.  The patient understand the proposed surgical procedure and wishes to proceed.     Kimeka Badour Jearld Adjutant, MD    01/16/2022 10:24 AM

## 2022-01-21 ENCOUNTER — Other Ambulatory Visit: Payer: Self-pay | Admitting: Surgery

## 2022-01-21 DIAGNOSIS — N6320 Unspecified lump in the left breast, unspecified quadrant: Secondary | ICD-10-CM

## 2022-02-05 ENCOUNTER — Encounter (HOSPITAL_BASED_OUTPATIENT_CLINIC_OR_DEPARTMENT_OTHER): Payer: Self-pay | Admitting: Surgery

## 2022-02-10 ENCOUNTER — Encounter (HOSPITAL_BASED_OUTPATIENT_CLINIC_OR_DEPARTMENT_OTHER)
Admission: RE | Admit: 2022-02-10 | Discharge: 2022-02-10 | Disposition: A | Discharge status: Home / Self Care | Payer: Medicare Other | Source: Ambulatory Visit | Attending: Surgery | Admitting: Surgery

## 2022-02-10 ENCOUNTER — Ambulatory Visit
Admission: RE | Admit: 2022-02-10 | Discharge: 2022-02-10 | Disposition: A | Discharge status: Home / Self Care | Payer: Medicare Other | Source: Ambulatory Visit | Attending: Surgery | Admitting: Surgery

## 2022-02-10 DIAGNOSIS — Z01810 Encounter for preprocedural cardiovascular examination: Secondary | ICD-10-CM | POA: Diagnosis not present

## 2022-02-10 DIAGNOSIS — N6320 Unspecified lump in the left breast, unspecified quadrant: Secondary | ICD-10-CM

## 2022-02-10 HISTORY — PX: BREAST BIOPSY: SHX20

## 2022-02-10 NOTE — Progress Notes (Signed)

## 2022-02-11 ENCOUNTER — Ambulatory Visit
Admission: RE | Admit: 2022-02-11 | Discharge: 2022-02-11 | Disposition: A | Discharge status: Home / Self Care | Payer: Medicare Other | Source: Ambulatory Visit | Attending: Surgery | Admitting: Surgery

## 2022-02-11 ENCOUNTER — Encounter (HOSPITAL_BASED_OUTPATIENT_CLINIC_OR_DEPARTMENT_OTHER)
Admission: RE | Disposition: A | Discharge status: Home / Self Care | Payer: Self-pay | Source: Home / Self Care | Attending: Surgery

## 2022-02-11 ENCOUNTER — Ambulatory Visit (HOSPITAL_BASED_OUTPATIENT_CLINIC_OR_DEPARTMENT_OTHER): Payer: Medicare Other | Admitting: Anesthesiology

## 2022-02-11 ENCOUNTER — Other Ambulatory Visit: Payer: Self-pay

## 2022-02-11 ENCOUNTER — Ambulatory Visit (HOSPITAL_BASED_OUTPATIENT_CLINIC_OR_DEPARTMENT_OTHER)
Admission: RE | Admit: 2022-02-11 | Discharge: 2022-02-11 | Disposition: A | Discharge status: Home / Self Care | Payer: Medicare Other | Attending: Surgery | Admitting: Surgery

## 2022-02-11 ENCOUNTER — Encounter (HOSPITAL_BASED_OUTPATIENT_CLINIC_OR_DEPARTMENT_OTHER): Payer: Self-pay | Admitting: Surgery

## 2022-02-11 DIAGNOSIS — I10 Essential (primary) hypertension: Secondary | ICD-10-CM

## 2022-02-11 DIAGNOSIS — E785 Hyperlipidemia, unspecified: Secondary | ICD-10-CM | POA: Diagnosis not present

## 2022-02-11 DIAGNOSIS — I2510 Atherosclerotic heart disease of native coronary artery without angina pectoris: Secondary | ICD-10-CM | POA: Diagnosis not present

## 2022-02-11 DIAGNOSIS — G4730 Sleep apnea, unspecified: Secondary | ICD-10-CM | POA: Diagnosis not present

## 2022-02-11 DIAGNOSIS — R921 Mammographic calcification found on diagnostic imaging of breast: Secondary | ICD-10-CM | POA: Insufficient documentation

## 2022-02-11 DIAGNOSIS — I4510 Unspecified right bundle-branch block: Secondary | ICD-10-CM | POA: Diagnosis not present

## 2022-02-11 DIAGNOSIS — Z01818 Encounter for other preprocedural examination: Secondary | ICD-10-CM

## 2022-02-11 DIAGNOSIS — N6320 Unspecified lump in the left breast, unspecified quadrant: Secondary | ICD-10-CM | POA: Diagnosis present

## 2022-02-11 DIAGNOSIS — F32A Depression, unspecified: Secondary | ICD-10-CM | POA: Diagnosis not present

## 2022-02-11 DIAGNOSIS — N6489 Other specified disorders of breast: Secondary | ICD-10-CM | POA: Insufficient documentation

## 2022-02-11 DIAGNOSIS — F419 Anxiety disorder, unspecified: Secondary | ICD-10-CM | POA: Insufficient documentation

## 2022-02-11 DIAGNOSIS — Z79899 Other long term (current) drug therapy: Secondary | ICD-10-CM | POA: Insufficient documentation

## 2022-02-11 DIAGNOSIS — N6022 Fibroadenosis of left breast: Secondary | ICD-10-CM | POA: Diagnosis not present

## 2022-02-11 DIAGNOSIS — K219 Gastro-esophageal reflux disease without esophagitis: Secondary | ICD-10-CM | POA: Insufficient documentation

## 2022-02-11 DIAGNOSIS — M1990 Unspecified osteoarthritis, unspecified site: Secondary | ICD-10-CM | POA: Diagnosis not present

## 2022-02-11 HISTORY — PX: RADIOACTIVE SEED GUIDED EXCISIONAL BREAST BIOPSY: SHX6490

## 2022-02-11 SURGERY — RADIOACTIVE SEED GUIDED BREAST BIOPSY
Anesthesia: General | Site: Breast | Laterality: Left

## 2022-02-11 MED ORDER — ACETAMINOPHEN 500 MG PO TABS
ORAL_TABLET | ORAL | Status: AC
  Filled 2022-02-11: qty 2

## 2022-02-11 MED ORDER — ONDANSETRON HCL 4 MG/2ML IJ SOLN
INTRAMUSCULAR | Status: DC | PRN
  Administered 2022-02-11: 4 mg via INTRAVENOUS

## 2022-02-11 MED ORDER — ONDANSETRON HCL 4 MG/2ML IJ SOLN
INTRAMUSCULAR | Status: AC
  Filled 2022-02-11: qty 2

## 2022-02-11 MED ORDER — DEXAMETHASONE SODIUM PHOSPHATE 4 MG/ML IJ SOLN
INTRAMUSCULAR | Status: DC | PRN
  Administered 2022-02-11: 5 mg via INTRAVENOUS

## 2022-02-11 MED ORDER — LIDOCAINE 2% (20 MG/ML) 5 ML SYRINGE
INTRAMUSCULAR | Status: AC
  Filled 2022-02-11: qty 5

## 2022-02-11 MED ORDER — CHLORHEXIDINE GLUCONATE CLOTH 2 % EX PADS: 6.0000 | MEDICATED_PAD | Freq: Once | CUTANEOUS | Status: DC

## 2022-02-11 MED ORDER — FENTANYL CITRATE (PF) 100 MCG/2ML IJ SOLN
INTRAMUSCULAR | Status: DC | PRN
  Administered 2022-02-11 (×2): 25 ug via INTRAVENOUS
  Administered 2022-02-11: 50 ug via INTRAVENOUS

## 2022-02-11 MED ORDER — CEFAZOLIN SODIUM-DEXTROSE 2-4 GM/100ML-% IV SOLN
INTRAVENOUS | Status: AC
  Filled 2022-02-11: qty 100

## 2022-02-11 MED ORDER — CEFAZOLIN SODIUM-DEXTROSE 2-4 GM/100ML-% IV SOLN
2.0000 g | INTRAVENOUS | Status: AC
  Administered 2022-02-11: 2 g via INTRAVENOUS

## 2022-02-11 MED ORDER — BUPIVACAINE-EPINEPHRINE (PF) 0.25% -1:200000 IJ SOLN
INTRAMUSCULAR | Status: AC
  Filled 2022-02-11: qty 30

## 2022-02-11 MED ORDER — BUPIVACAINE-EPINEPHRINE 0.25% -1:200000 IJ SOLN
INTRAMUSCULAR | Status: DC | PRN
  Administered 2022-02-11: 10 mL

## 2022-02-11 MED ORDER — PROPOFOL 10 MG/ML IV BOLUS
INTRAVENOUS | Status: DC | PRN
  Administered 2022-02-11: 120 mg via INTRAVENOUS

## 2022-02-11 MED ORDER — EPHEDRINE SULFATE (PRESSORS) 50 MG/ML IJ SOLN
INTRAMUSCULAR | Status: DC | PRN
  Administered 2022-02-11: 10 mg via INTRAVENOUS

## 2022-02-11 MED ORDER — ACETAMINOPHEN 500 MG PO TABS
1000.0000 mg | ORAL_TABLET | ORAL | Status: AC
  Administered 2022-02-11: 500 mg via ORAL

## 2022-02-11 MED ORDER — PROPOFOL 500 MG/50ML IV EMUL
INTRAVENOUS | Status: AC
  Filled 2022-02-11: qty 50

## 2022-02-11 MED ORDER — FENTANYL CITRATE (PF) 100 MCG/2ML IJ SOLN
INTRAMUSCULAR | Status: AC
  Filled 2022-02-11: qty 2

## 2022-02-11 MED ORDER — DEXAMETHASONE SODIUM PHOSPHATE 10 MG/ML IJ SOLN
INTRAMUSCULAR | Status: AC
  Filled 2022-02-11: qty 1

## 2022-02-11 MED ORDER — LACTATED RINGERS IV SOLN: INTRAVENOUS | Status: DC

## 2022-02-11 MED ORDER — LIDOCAINE HCL (CARDIAC) PF 100 MG/5ML IV SOSY
PREFILLED_SYRINGE | INTRAVENOUS | Status: DC | PRN
  Administered 2022-02-11: 60 mg via INTRAVENOUS

## 2022-02-11 SURGICAL SUPPLY — 47 items
APL PRP STRL LF DISP 70% ISPRP (MISCELLANEOUS) ×1
APL SKNCLS STERI-STRIP NONHPOA (GAUZE/BANDAGES/DRESSINGS) ×1
APPLIER CLIP 9.375 MED OPEN (MISCELLANEOUS)
APR CLP MED 9.3 20 MLT OPN (MISCELLANEOUS)
BENZOIN TINCTURE PRP APPL 2/3 (GAUZE/BANDAGES/DRESSINGS) ×2 IMPLANT
BLADE HEX COATED 2.75 (ELECTRODE) ×2 IMPLANT
BLADE SURG 15 STRL LF DISP TIS (BLADE) ×2 IMPLANT
BLADE SURG 15 STRL SS (BLADE) ×1
CANISTER SUCT 1200ML W/VALVE (MISCELLANEOUS) ×2 IMPLANT
CHLORAPREP W/TINT 26 (MISCELLANEOUS) ×2 IMPLANT
CLIP APPLIE 9.375 MED OPEN (MISCELLANEOUS) IMPLANT
COVER BACK TABLE 60X90IN (DRAPES) ×2 IMPLANT
COVER MAYO STAND STRL (DRAPES) ×2 IMPLANT
COVER PROBE CYLINDRICAL 5X96 (MISCELLANEOUS) ×2 IMPLANT
DRAPE LAPAROTOMY 100X72 PEDS (DRAPES) ×2 IMPLANT
DRAPE UTILITY XL STRL (DRAPES) ×2 IMPLANT
DRSG TEGADERM 4X4.75 (GAUZE/BANDAGES/DRESSINGS) ×2 IMPLANT
ELECT REM PT RETURN 9FT ADLT (ELECTROSURGICAL) ×1
ELECTRODE REM PT RTRN 9FT ADLT (ELECTROSURGICAL) ×2 IMPLANT
GAUZE SPONGE 4X4 12PLY STRL LF (GAUZE/BANDAGES/DRESSINGS) ×2 IMPLANT
GLOVE BIO SURGEON STRL SZ7 (GLOVE) ×2 IMPLANT
GLOVE BIOGEL PI IND STRL 7.5 (GLOVE) ×2 IMPLANT
GOWN STRL REUS W/ TWL LRG LVL3 (GOWN DISPOSABLE) ×4 IMPLANT
GOWN STRL REUS W/TWL LRG LVL3 (GOWN DISPOSABLE) ×2
ILLUMINATOR WAVEGUIDE N/F (MISCELLANEOUS) IMPLANT
KIT MARKER MARGIN INK (KITS) ×2 IMPLANT
LIGHT WAVEGUIDE WIDE FLAT (MISCELLANEOUS) IMPLANT
NDL HYPO 25X1 1.5 SAFETY (NEEDLE) ×2 IMPLANT
NEEDLE HYPO 25X1 1.5 SAFETY (NEEDLE) ×1 IMPLANT
NS IRRIG 1000ML POUR BTL (IV SOLUTION) ×2 IMPLANT
PACK BASIN DAY SURGERY FS (CUSTOM PROCEDURE TRAY) ×2 IMPLANT
PENCIL SMOKE EVACUATOR (MISCELLANEOUS) ×2 IMPLANT
SLEEVE SCD COMPRESS KNEE MED (STOCKING) ×2 IMPLANT
SPIKE FLUID TRANSFER (MISCELLANEOUS) IMPLANT
SPONGE GAUZE 2X2 8PLY STRL LF (GAUZE/BANDAGES/DRESSINGS) IMPLANT
SPONGE T-LAP 18X18 ~~LOC~~+RFID (SPONGE) IMPLANT
SPONGE T-LAP 4X18 ~~LOC~~+RFID (SPONGE) ×2 IMPLANT
STRIP CLOSURE SKIN 1/2X4 (GAUZE/BANDAGES/DRESSINGS) ×2 IMPLANT
SUT MON AB 4-0 PC3 18 (SUTURE) ×2 IMPLANT
SUT SILK 2 0 SH (SUTURE) IMPLANT
SUT VIC AB 3-0 SH 27 (SUTURE) ×1
SUT VIC AB 3-0 SH 27X BRD (SUTURE) ×2 IMPLANT
SYR CONTROL 10ML LL (SYRINGE) ×2 IMPLANT
TOWEL GREEN STERILE FF (TOWEL DISPOSABLE) ×2 IMPLANT
TRAY FAXITRON CT DISP (TRAY / TRAY PROCEDURE) ×2 IMPLANT
TUBE CONNECTING 20X1/4 (TUBING) ×2 IMPLANT
YANKAUER SUCT BULB TIP NO VENT (SUCTIONS) ×2 IMPLANT

## 2022-02-11 NOTE — Anesthesia Preprocedure Evaluation (Signed)
Anesthesia Evaluation  Patient identified by MRN, date of birth, ID band  Reviewed: Allergy & Precautions, NPO status , Patient's Chart, lab work & pertinent test results  Airway Mallampati: II  TM Distance: >3 FB     Dental  (+) Edentulous Upper, Edentulous Lower   Pulmonary sleep apnea    breath sounds clear to auscultation       Cardiovascular hypertension, Pt. on medications + CAD   Rhythm:Regular Rate:Normal  Echo 2017 - Left ventricle: The cavity size was normal. Wall thickness was    normal. Systolic function was normal. The estimated ejection    fraction was in the range of 60% to 65%. Wall motion was normal;    there were no regional wall motion abnormalities. Doppler    parameters are consistent with abnormal left ventricular    relaxation (grade 1 diastolic dysfunction).  - Aortic valve: Mildly calcified annulus. Trileaflet; mildly    thickened leaflets. Valve area (VTI): 2.92 cm^2. Valve area    (Vmax): 2.95 cm^2. Valve area (Vmean): 2.91 cm^2.  - Mitral valve: Mildly calcified annulus. Normal thickness leaflets    NM Stress 2017  Blood pressure demonstrated a hypertensive response to exercise.  There was no ST segment deviation noted during stress.  The study is normal.  This is a low risk study.  Nuclear stress EF: 72%.    Neuro/Psych  Headaches PSYCHIATRIC DISORDERS Anxiety Depression       GI/Hepatic Neg liver ROS, PUD,GERD  ,,  Endo/Other    Renal/GU negative Renal ROS     Musculoskeletal  (+) Arthritis ,    Abdominal   Peds  Hematology   Anesthesia Other Findings   Reproductive/Obstetrics                             Anesthesia Physical Anesthesia Plan  ASA: 3  Anesthesia Plan: General   Post-op Pain Management: Tylenol PO (pre-op)* and Celebrex PO (pre-op)*   Induction: Intravenous  PONV Risk Score and Plan: 3 and Ondansetron, Dexamethasone, Midazolam,  Propofol infusion and Treatment may vary due to age or medical condition  Airway Management Planned: LMA  Additional Equipment:   Intra-op Plan:   Post-operative Plan: Extubation in OR  Informed Consent: I have reviewed the patients History and Physical, chart, labs and discussed the procedure including the risks, benefits and alternatives for the proposed anesthesia with the patient or authorized representative who has indicated his/her understanding and acceptance.     Dental advisory given  Plan Discussed with: CRNA  Anesthesia Plan Comments:         Anesthesia Quick Evaluation

## 2022-02-11 NOTE — Discharge Instructions (Addendum)
[facility] Office Phone Number [phone]  BREAST BIOPSY/ PARTIAL MASTECTOMY: POST OP INSTRUCTIONS  Always review your discharge instruction sheet given to you by the facility where your surgery was performed.  IF YOU HAVE DISABILITY OR FAMILY LEAVE FORMS, YOU MUST BRING THEM TO THE OFFICE FOR PROCESSING.  DO NOT GIVE THEM TO YOUR DOCTOR.  A prescription for pain medication may be given to you upon discharge.  Take your pain medication as prescribed, if needed.  If narcotic pain medicine is not needed, then you may take acetaminophen (Tylenol) or ibuprofen (Advil) as needed. Take your usually prescribed medications unless otherwise directed If you need a refill on your pain medication, please contact your pharmacy.  They will contact our office to request authorization.  Prescriptions will not be filled after 5pm or on week-ends. You should eat very light the first 24 hours after surgery, such as soup, crackers, pudding, etc.  Resume your normal diet the day after surgery. Most patients will experience some swelling and bruising in the breast.  Ice packs and a good support bra will help.  Swelling and bruising can take several days to resolve.  It is common to experience some constipation if taking pain medication after surgery.  Increasing fluid intake and taking a stool softener will usually help or prevent this problem from occurring.  A mild laxative (Milk of Magnesia or Miralax) should be taken according to package directions if there are no bowel movements after 48 hours. Unless discharge instructions indicate otherwise, you may remove your bandages 48 hours after surgery, and you may shower at that time.  You will have steri-strips (small skin tapes) in place directly over the incision.  These strips should be left on the skin for 7-10 days.   Any sutures or staples will be removed at the office during your follow-up visit. ACTIVITIES:  You may resume regular daily  activities (gradually increasing) beginning the next day.  Wearing a good support bra or sports bra minimizes pain and swelling.  You may have sexual intercourse when it is comfortable. You may drive when you no longer are taking prescription pain medication, you can comfortably wear a seatbelt, and you can safely maneuver your car and apply brakes. RETURN TO WORK:  1-2 weeks You should see your doctor in the office for a follow-up appointment approximately two weeks after your surgery.  Your doctor's nurse will typically make your follow-up appointment when she calls you with your pathology report.  Expect your pathology report 2-3 business days after your surgery.  You may call to check if you do not hear from [place] after three days. OTHER INSTRUCTIONS: _______________________________________________________________________________________________ _____________________________________________________________________________________________________________________________________ _____________________________________________________________________________________________________________________________________ _____________________________________________________________________________________________________________________________________  WHEN TO CALL YOUR DOCTOR: Fever over 101.0 Nausea and/or vomiting. Extreme swelling or bruising. Continued bleeding from incision. Increased pain, redness, or drainage from the incision.  The clinic staff is available to answer your questions during regular business hours.  Please don't hesitate to call and ask to speak to one of the nurses for clinical concerns.  If you have a medical emergency, go to the nearest emergency room or call 911.  A surgeon from [facility] is always on call at the hospital.  For further questions, please visit [email or web address]    May take Tylenol after 6pm, if needed.    Post Anesthesia Home Care  Instructions  Activity: Get plenty of rest for the remainder of the day. A responsible individual must stay with you for 24 hours following the procedure.  For the next 24 hours, DO NOT: -Drive a car -Paediatric nurse -Drink alcoholic beverages -Take any medication unless instructed by your physician -Make any legal decisions or sign important papers.  Meals: Start with liquid foods such as gelatin or soup. Progress to regular foods as tolerated. Avoid greasy, spicy, heavy foods. If nausea and/or vomiting occur, drink only clear liquids until the nausea and/or vomiting subsides. Call your physician if vomiting continues.  Special Instructions/Symptoms: Your throat may feel dry or sore from the anesthesia or the breathing tube placed in your throat during surgery. If this causes discomfort, gargle with warm salt water. The discomfort should disappear within 24 hours.  If you had a scopolamine patch placed behind your ear for the management of post- operative nausea and/or vomiting:  1. The medication in the patch is effective for 72 hours, after which it should be removed.  Wrap patch in a tissue and discard in the trash. Wash hands thoroughly with soap and water. 2. You may remove the patch earlier than 72 hours if you experience unpleasant side effects which may include dry mouth, dizziness or visual disturbances. 3. Avoid touching the patch. Wash your hands with soap and water after contact with the patch.

## 2022-02-11 NOTE — Interval H&P Note (Signed)
History and Physical Interval Note:  02/11/2022 11:34 AM  [name] [name]  has presented today for surgery, with the diagnosis of LEFT BREAST MASS.  The various methods of treatment have been discussed with the patient and family. After consideration of risks, benefits and other options for treatment, the patient has consented to  Procedure(s): RADIOACTIVE SEED GUIDED EXCISIONAL LEFT BREAST BIOPSY (Left) as a surgical intervention.  The patient's history has been reviewed, patient examined, no change in status, stable for surgery.  I have reviewed the patient's chart and labs.  Questions were answered to the patient's satisfaction.     Wynona Luna

## 2022-02-11 NOTE — Transfer of Care (Signed)
Immediate Anesthesia Transfer of Care Note  Patient: [name] [name]  Procedure(s) Performed: RADIOACTIVE SEED GUIDED EXCISIONAL LEFT BREAST BIOPSY (Left: Breast)  Patient Location: PACU  Anesthesia Type:General  Level of Consciousness: sedated  Airway & Oxygen Therapy: Patient Spontanous Breathing and Patient connected to face mask oxygen  Post-op Assessment: Report given to RN and Post -op Vital signs reviewed and stable  Post vital signs: Reviewed and stable  Last Vitals:  Vitals Value Taken Time  BP    Temp    Pulse    Resp 10 02/11/22 1312  SpO2    Vitals shown include unvalidated device data.  Last Pain:  Vitals:   02/11/22 1151  PainSc: 0-No pain      Patients Stated Pain Goal: 4 (02/11/22 1151)  Complications: No notable events documented.

## 2022-02-11 NOTE — Anesthesia Postprocedure Evaluation (Signed)
Anesthesia Post Note  Patient: [name] [name]  Procedure(s) Performed: RADIOACTIVE SEED GUIDED EXCISIONAL LEFT BREAST BIOPSY (Left: Breast)     Patient location during evaluation: PACU Anesthesia Type: General Level of consciousness: sedated and patient cooperative Pain management: pain level controlled Vital Signs Assessment: post-procedure vital signs reviewed and stable Respiratory status: spontaneous breathing Cardiovascular status: stable Anesthetic complications: no   No notable events documented.  Last Vitals:  Vitals:   02/11/22 1330 02/11/22 1352  BP: (!) 140/65 (!) 145/74  Pulse: 82 63  Resp: 16 19  Temp:  36.6 C  SpO2: 94% 95%    Last Pain:  Vitals:   02/11/22 1352  PainSc: 0-No pain                 Lewie Loron

## 2022-02-11 NOTE — Op Note (Signed)
Pre-op Diagnosis:  Left breast mass Post-op Diagnosis: same Procedure:  Left radioactive seed localized lumpectomy Surgeon:  Jazmen Lindenbaum K. Anesthesia:  GEN - LMA Indications:   This is a 76 year old female who presented in October 2022 after a routine screening mammogram that revealed an area of distortion in the left breast US showed a vague mass at 10:00 4 cmfn.  This was biopsied and revealed only usual ductal hyperplasia, which was felt to be discordant.  She developed a large hematoma in the biopsy site, which has displaced the biopsy clip from the area of distortion.  The biopsy was performed on 12/05/20.   In 2019, the patient had a right radioactive seed localized lumpectomy for a separate discordant biopsy.  Lumpectomy at that time revealed a radial scar but no sign of malignancy.   We recommended 42-monthfollow-up imaging after resolution of the hematoma.  Unfortunately, the patient did not have any further imaging until her annual mammogram on 01/07/2022.  She continues to have an area of distortion in the left breast in the retroareolar region at 6:00.  She presents now to discuss excision.  The patient has noticed some pain radiating from her sternum towards her left breast.  This is mostly in the lower part of her breast.  This has been present for several months.   Description of procedure: The patient is brought to the operating room placed in supine position on the operating room table. After an adequate level of general anesthesia was obtained, her left breast was prepped with ChloraPrep and draped in sterile fashion. A timeout was taken to ensure the proper patient and proper procedure. We interrogated the breast with the neoprobe. We made a circumareolar incision around the upper side of the nipple after infiltrating with 0.25% Marcaine. Dissection was carried down in the breast tissue with cautery. We used the neoprobe to guide uKoreatowards the radioactive seed. We excised an area of  tissue around the radioactive seed 1.5 cm in diameter. The specimen was removed and was oriented with a paint kit. Specimen mammogram showed the radioactive seed as well as the biopsy clip within the specimen. This was sent for pathologic examination. There is no residual radioactivity within the biopsy cavity. We inspected carefully for hemostasis. The wound was thoroughly irrigated. The wound was closed with a deep layer of 3-0 Vicryl and a subcuticular layer of 4-0 Monocryl. Benzoin Steri-Strips were applied. The patient was then extubated and brought to the recovery room in stable condition. All sponge, instrument, and needle counts are correct.  MImogene Burn TGeorgette Dover MD, FAustin Eye Laser And SurgicenterSurgery  General/ Trauma Surgery  02/11/2022 1:05 PM

## 2022-02-11 NOTE — Anesthesia Procedure Notes (Signed)
Procedure Name: LMA Insertion Date/Time: 02/11/2022 12:31 PM  Performed by: Burna Cash, CRNAPre-anesthesia Checklist: Patient identified, Emergency Drugs available, Suction available and Patient being monitored Patient Re-evaluated:Patient Re-evaluated prior to induction Oxygen Delivery Method: Circle system utilized Preoxygenation: Pre-oxygenation with 100% oxygen Induction Type: IV induction Ventilation: Mask ventilation without difficulty LMA: LMA inserted LMA Size: 4.0 Number of attempts: 1 Airway Equipment and Method: Bite block Placement Confirmation: positive ETCO2 Tube secured with: Tape Dental Injury: Teeth and Oropharynx as per pre-operative assessment

## 2022-02-18 LAB — SURGICAL PATHOLOGY

## 2022-02-19 ENCOUNTER — Encounter (HOSPITAL_COMMUNITY): Payer: Self-pay

## 2023-03-08 ENCOUNTER — Other Ambulatory Visit: Payer: Self-pay | Admitting: Surgery

## 2023-03-08 DIAGNOSIS — Z1231 Encounter for screening mammogram for malignant neoplasm of breast: Secondary | ICD-10-CM

## 2023-03-23 ENCOUNTER — Other Ambulatory Visit: Payer: Self-pay | Admitting: Family Medicine

## 2023-03-23 DIAGNOSIS — N6321 Unspecified lump in the left breast, upper outer quadrant: Secondary | ICD-10-CM

## 2023-04-08 ENCOUNTER — Ambulatory Visit
Admission: RE | Admit: 2023-04-08 | Discharge: 2023-04-08 | Disposition: A | Discharge status: Home / Self Care | Payer: Medicare Other | Source: Ambulatory Visit | Attending: Family Medicine | Admitting: Family Medicine

## 2023-04-08 ENCOUNTER — Encounter: Payer: Self-pay | Admitting: Family Medicine

## 2023-04-08 ENCOUNTER — Other Ambulatory Visit: Payer: Self-pay | Admitting: Family Medicine

## 2023-04-08 DIAGNOSIS — N6321 Unspecified lump in the left breast, upper outer quadrant: Secondary | ICD-10-CM

## 2023-04-08 DIAGNOSIS — N6320 Unspecified lump in the left breast, unspecified quadrant: Secondary | ICD-10-CM

## 2023-04-13 ENCOUNTER — Ambulatory Visit
Admission: RE | Admit: 2023-04-13 | Discharge: 2023-04-13 | Disposition: A | Discharge status: Home / Self Care | Payer: Medicare Other | Source: Ambulatory Visit | Attending: Family Medicine | Admitting: Family Medicine

## 2023-04-13 DIAGNOSIS — N6320 Unspecified lump in the left breast, unspecified quadrant: Secondary | ICD-10-CM

## 2023-04-13 DIAGNOSIS — N6321 Unspecified lump in the left breast, upper outer quadrant: Secondary | ICD-10-CM

## 2023-04-13 HISTORY — PX: BREAST BIOPSY: SHX20

## 2023-04-15 LAB — SURGICAL PATHOLOGY

## 2023-04-23 ENCOUNTER — Ambulatory Visit: Payer: Self-pay | Admitting: Surgery

## 2023-04-23 DIAGNOSIS — D242 Benign neoplasm of left breast: Secondary | ICD-10-CM

## 2023-04-23 NOTE — H&P (Signed)
Subjective    Chief Complaint: New Problem ( Lt sclerotic intraductal papilloma)       History of Present Illness: [name] [name] is a 78 y.o. female who is seen today as an office consultation at the request of Dr. Jesse Sans for evaluation of New Problem ( Lt sclerotic intraductal papilloma) .   This is a 78 year old female who presented in October 2022 after a routine screening mammogram that revealed an area of distortion in the left breast US showed a vague mass at 10:00 4 cmfn.  This was biopsied and revealed only usual ductal hyperplasia, which was felt to be discordant.  She developed a large hematoma in the biopsy site, which has displaced the biopsy clip from the area of distortion.  The biopsy was performed on 12/05/20.   In 2019, the patient had a right radioactive seed localized lumpectomy for a separate discordant biopsy.  Lumpectomy at that time revealed a radial scar but no sign of malignancy.   We recommended 29-month follow-up imaging after resolution of the hematoma.  Unfortunately, the patient did not have any further imaging until her annual mammogram on 01/07/2022.  She continues to have an area of distortion in the left breast in the retroareolar region at 6:00.  She presents now to discuss excision.  The patient has noticed some pain radiating from her sternum towards her left breast.  This is mostly in the lower part of her breast.  This has been present for several months.   On 02/11/2022, she underwent left breast radioactive seed localized lumpectomy.  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  No sign of malignancy.  The patient has sensitivity at her incision for many months after her surgery.   Recently the patient developed spontaneous bruising and a small palpable mass in the left upper outer quadrant.  The spontaneous bruising resolved within a week.  Subsequently she underwent routine bilateral diagnostic  mammograms for her annual surveillance.  She had an area of concern in the left upper outer breast that revealed a 0.9 cm mass in the left breast at 2:00 located 5 cm from the nipple.  This corresponds to the area of palpable mass.  She underwent biopsy of this area that revealed intraductal papilloma.  The patient had significant bruising and a palpable hematoma in this area.     Medical History: Past Medical History      Past Medical History:  Diagnosis Date   Hypertension     Sleep apnea          Problem List     Patient Active Problem List  Diagnosis   Anxiety state, unspecified   CAD (coronary artery disease)   Depression   Esophageal reflux   Hyperlipidemia   Melena   Peptic ulcer   Rectal bleeding   Syncope   Hypertension   Vertigo   Subareolar mass of left breast        Past Surgical History       Past Surgical History:  Procedure Laterality Date   ABDOMINAL AORTIC ANEURYSM REPAIR       MASTECTOMY PARTIAL / LUMPECTOMY        2018        Allergies       Allergies  Allergen Reactions   Aspirin Hives   Codeine Unknown      REACTION: Hallucinations   Penicillins Unknown      REACTION: Rash  Medications Ordered Prior to Encounter        Current Outpatient Medications on File Prior to Visit  Medication Sig Dispense Refill   amLODIPine (NORVASC) 2.5 MG tablet Take 2.5 mg by mouth once daily       aspirin 81 MG EC tablet Take 81 mg by mouth once daily       atorvastatin (LIPITOR) 20 MG tablet Take 20 mg by mouth once daily       zolpidem (AMBIEN) 10 mg tablet Take 10 mg by mouth at bedtime        No current facility-administered medications on file prior to visit.        Family History  Family History  Family history unknown: Yes        Tobacco Use History  Social History       Tobacco Use  Smoking Status Never  Smokeless Tobacco Never        Social History  Social History        Socioeconomic History   Marital status:  Married  Tobacco Use   Smoking status: Never   Smokeless tobacco: Never  Vaping Use   Vaping status: Never Used  Substance and Sexual Activity   Alcohol use: Never   Drug use: Never   Sexual activity: Defer    Social Drivers of Health        Housing Stability: Unknown (04/23/2023)    Housing Stability Vital Sign     Homeless in the Last Year: No        Objective:         Vitals:    04/23/23 0951  BP: (!) 142/80  Pulse: 80  Temp: 36.4 C (97.5 F)  SpO2: 98%  Weight: 78.3 kg (172 lb 9.6 oz)  Height: 152.4 cm (5')  PainSc:   7    Body mass index is 33.71 kg/m.   Physical Exam    Constitutional:  WDWN in NAD, conversant, no obvious deformities; lying in bed comfortably Eyes:  Pupils equal, round; sclera anicteric; moist conjunctiva; no lid lag HENT:  Oral mucosa moist; good dentition  Neck:  No masses palpated, trachea midline; no thyromegaly Lungs:  CTA bilaterally; normal respiratory effort Breasts:  symmetric, no nipple changes; no palpable masses or lymphadenopathy on the right side.  Left breast shows significant ecchymosis and a 3 cm hematoma in the left upper outer quadrant.  No other palpable masses.  No nipple discharge. CV:  Regular rate and rhythm; no murmurs; extremities well-perfused with no edema Abd:  +bowel sounds, soft, non-tender, no palpable organomegaly; no palpable hernias Musc: Normal gait; no apparent clubbing or cyanosis in extremities Lymphatic:  No palpable cervical or axillary lymphadenopathy Skin:  Warm, dry; no sign of jaundice Psychiatric - alert and oriented x 4; calm mood and affect     Labs, Imaging and Diagnostic Testing: CLINICAL DATA:  78 year old female with history of left breast bruising and subsequent palpable area of concern. The bruising has since resolved. History of benign left breast excision 02/11/2022.   EXAM: DIGITAL DIAGNOSTIC BILATERAL MAMMOGRAM WITH TOMOSYNTHESIS AND CAD; ULTRASOUND LEFT BREAST; ULTRASOUND  RIGHT BREAST   TECHNIQUE: Bilateral digital diagnostic mammography and breast tomosynthesis was performed. The images were evaluated with computer-aided detection. Targeted ultrasound of the bilateral breasts was performed.   COMPARISON:  Previous exam(s).   ACR Breast Density Category b: There are scattered areas of fibroglandular density.   FINDINGS: Postsurgical changes are present in the central to slightly upper left  breast related to interval benign excision. Spot compression tomograms were performed at the site of palpable concern in the upper-outer left breast demonstrating a mass with margin irregularity measuring 1.2 cm. In addition located slightly more superficially there are several adjacent fat containing masses compatible with oil cyst/fat necrosis. There is an oval circumscribed mass in the slightly upper inner right breast measuring 0.6 cm.   Physical examination at site of palpable concern in the left breast reveals a firm mass at the approximate 2 o'clock position.   Targeted ultrasound of the right breast was performed. There is a simple cyst in the right breast at 1 o'clock 4 cm from nipple measuring 0.7 x 0.3 x 0.8 cm. This corresponds well with the mass seen in the slightly upper inner right breast at mammography. No suspicious masses or any other worrisome abnormality seen in the upper inner right breast sonographically.   Targeted ultrasound of the left breast was performed. There is an irregular hypoechoic mass in the left breast at 2 o'clock 5 cm from nipple measuring 0.9 x 0.8 x 0.6 cm. This corresponds with the area of palpable concern and mammography findings. In addition, there is a hyperechoic area containing cystic spaces at 2 o'clock 2 cm from nipple measuring 2.3 x 0.6 x 1.5 cm. This corresponds with the fat necrosis seen in the left breast at mammography.   Normal lymph nodes are present in the left axilla.   IMPRESSION: Suspicious  palpable 0.9 cm mass in the left breast at 2 o'clock 5 cm from nipple.   RECOMMENDATION: Recommend ultrasound-guided core biopsy of the palpable mass in the left breast at 2 o'clock 5 cm from nipple.   I have discussed the findings and recommendations with the patient. If applicable, a reminder letter will be sent to the patient regarding the next appointment.   BI-RADS CATEGORY  4: Suspicious.     Electronically Signed   By: Edwin Cap M.D.   On: 04/13/2023 11:12   Assessment and Plan:  Diagnoses and all orders for this visit:   Intraductal papilloma of breast, left     Recommend left breast radioactive seed localized lumpectomy.  Recommend waiting about a month to allow the hematoma to resolve.   The surgical procedure has been discussed with the patient.  Potential risks, benefits, alternative treatments, and expected outcomes have been explained.  All of the patient's questions at this time have been answered.  The likelihood of reaching the patient's treatment goal is good.  The patient understands the proposed surgical procedure and wishes to proceed.     Daniel Ritthaler Delbert Harness, MD  04/23/2023 10:13 AM

## 2023-04-27 ENCOUNTER — Other Ambulatory Visit: Payer: Self-pay | Admitting: Surgery

## 2023-04-27 DIAGNOSIS — D242 Benign neoplasm of left breast: Secondary | ICD-10-CM

## 2023-05-18 ENCOUNTER — Encounter (HOSPITAL_BASED_OUTPATIENT_CLINIC_OR_DEPARTMENT_OTHER): Payer: Self-pay | Admitting: Surgery

## 2023-05-18 ENCOUNTER — Other Ambulatory Visit: Payer: Self-pay

## 2023-05-20 ENCOUNTER — Encounter (HOSPITAL_BASED_OUTPATIENT_CLINIC_OR_DEPARTMENT_OTHER)
Admission: RE | Admit: 2023-05-20 | Discharge: 2023-05-20 | Disposition: A | Discharge status: Home / Self Care | Payer: Medicare Other | Source: Ambulatory Visit | Attending: Surgery | Admitting: Surgery

## 2023-05-20 DIAGNOSIS — Z01818 Encounter for other preprocedural examination: Secondary | ICD-10-CM | POA: Insufficient documentation

## 2023-05-20 MED ORDER — CHLORHEXIDINE GLUCONATE CLOTH 2 % EX PADS: 6.0000 | MEDICATED_PAD | Freq: Once | CUTANEOUS | Status: DC

## 2023-05-20 MED ORDER — CHLORHEXIDINE GLUCONATE CLOTH 2 % EX PADS
6.0000 | MEDICATED_PAD | Freq: Once | CUTANEOUS | Status: DC
Start: 2023-05-20 — End: 2023-05-25

## 2023-05-20 NOTE — Progress Notes (Signed)

## 2023-05-24 ENCOUNTER — Ambulatory Visit
Admission: RE | Admit: 2023-05-24 | Discharge: 2023-05-24 | Disposition: A | Discharge status: Home / Self Care | Payer: Medicare Other | Source: Ambulatory Visit | Attending: Surgery | Admitting: Surgery

## 2023-05-24 DIAGNOSIS — D242 Benign neoplasm of left breast: Secondary | ICD-10-CM

## 2023-05-24 HISTORY — PX: BREAST BIOPSY: SHX20

## 2023-05-24 NOTE — Anesthesia Preprocedure Evaluation (Signed)
 Anesthesia Evaluation  Patient identified by MRN, date of birth, ID band Patient awake    Reviewed: Allergy & Precautions, NPO status , Patient's Chart, lab work & pertinent test results  History of Anesthesia Complications Negative for: history of anesthetic complications  Airway Mallampati: II  TM Distance: >3 FB Neck ROM: Full    Dental  (+) Edentulous Lower, Edentulous Upper   Pulmonary sleep apnea and Continuous Positive Airway Pressure Ventilation    Pulmonary exam normal        Cardiovascular hypertension, Pt. on medications + CAD  Normal cardiovascular exam     Neuro/Psych  Headaches  Anxiety Depression       GI/Hepatic Neg liver ROS, PUD,GERD  ,,  Endo/Other  negative endocrine ROS    Renal/GU negative Renal ROS  negative genitourinary   Musculoskeletal  (+) Arthritis ,    Abdominal   Peds  Hematology negative hematology ROS (+)   Anesthesia Other Findings Day of surgery medications reviewed with patient.  Reproductive/Obstetrics negative OB ROS                              Anesthesia Physical Anesthesia Plan  ASA: 2  Anesthesia Plan: General   Post-op Pain Management: Tylenol PO (pre-op)*   Induction: Intravenous  PONV Risk Score and Plan: 3 and Ondansetron, Dexamethasone and Treatment may vary due to age or medical condition  Airway Management Planned: LMA  Additional Equipment: None  Intra-op Plan:   Post-operative Plan: Extubation in OR  Informed Consent: I have reviewed the patients History and Physical, chart, labs and discussed the procedure including the risks, benefits and alternatives for the proposed anesthesia with the patient or authorized representative who has indicated his/her understanding and acceptance.     Dental advisory given  Plan Discussed with: CRNA  Anesthesia Plan Comments:         Anesthesia Quick Evaluation

## 2023-05-25 ENCOUNTER — Other Ambulatory Visit: Payer: Self-pay

## 2023-05-25 ENCOUNTER — Ambulatory Visit (HOSPITAL_BASED_OUTPATIENT_CLINIC_OR_DEPARTMENT_OTHER): Payer: Self-pay | Admitting: Anesthesiology

## 2023-05-25 ENCOUNTER — Ambulatory Visit (HOSPITAL_BASED_OUTPATIENT_CLINIC_OR_DEPARTMENT_OTHER)
Admission: RE | Admit: 2023-05-25 | Discharge: 2023-05-25 | Disposition: A | Discharge status: Home / Self Care | Payer: Medicare Other | Source: Ambulatory Visit | Attending: Surgery | Admitting: Surgery

## 2023-05-25 ENCOUNTER — Encounter (HOSPITAL_BASED_OUTPATIENT_CLINIC_OR_DEPARTMENT_OTHER): Payer: Self-pay | Admitting: Surgery

## 2023-05-25 ENCOUNTER — Ambulatory Visit
Admission: RE | Admit: 2023-05-25 | Discharge: 2023-05-25 | Disposition: A | Discharge status: Home / Self Care | Payer: Medicare Other | Source: Ambulatory Visit | Attending: Surgery | Admitting: Surgery

## 2023-05-25 ENCOUNTER — Encounter (HOSPITAL_BASED_OUTPATIENT_CLINIC_OR_DEPARTMENT_OTHER)
Admission: RE | Disposition: A | Discharge status: Home / Self Care | Payer: Self-pay | Source: Ambulatory Visit | Attending: Surgery

## 2023-05-25 DIAGNOSIS — K219 Gastro-esophageal reflux disease without esophagitis: Secondary | ICD-10-CM | POA: Diagnosis not present

## 2023-05-25 DIAGNOSIS — I2510 Atherosclerotic heart disease of native coronary artery without angina pectoris: Secondary | ICD-10-CM | POA: Insufficient documentation

## 2023-05-25 DIAGNOSIS — I10 Essential (primary) hypertension: Secondary | ICD-10-CM | POA: Diagnosis not present

## 2023-05-25 DIAGNOSIS — D242 Benign neoplasm of left breast: Secondary | ICD-10-CM

## 2023-05-25 DIAGNOSIS — N6489 Other specified disorders of breast: Secondary | ICD-10-CM | POA: Diagnosis present

## 2023-05-25 DIAGNOSIS — G4730 Sleep apnea, unspecified: Secondary | ICD-10-CM | POA: Insufficient documentation

## 2023-05-25 HISTORY — PX: RADIOACTIVE SEED GUIDED EXCISIONAL BREAST BIOPSY: SHX6490

## 2023-05-25 SURGERY — RADIOACTIVE SEED GUIDED BREAST BIOPSY
Anesthesia: General | Site: Breast | Laterality: Left

## 2023-05-25 MED ORDER — LACTATED RINGERS IV SOLN: INTRAVENOUS | Status: DC

## 2023-05-25 MED ORDER — LIDOCAINE 2% (20 MG/ML) 5 ML SYRINGE
INTRAMUSCULAR | Status: AC
  Filled 2023-05-25: qty 5

## 2023-05-25 MED ORDER — ACETAMINOPHEN 500 MG PO TABS: 1000.0000 mg | ORAL_TABLET | ORAL | Status: DC

## 2023-05-25 MED ORDER — EPHEDRINE SULFATE-NACL 50-0.9 MG/10ML-% IV SOSY
PREFILLED_SYRINGE | INTRAVENOUS | Status: DC | PRN
  Administered 2023-05-25: 10 mg via INTRAVENOUS

## 2023-05-25 MED ORDER — FENTANYL CITRATE (PF) 100 MCG/2ML IJ SOLN
INTRAMUSCULAR | Status: AC
  Filled 2023-05-25: qty 2

## 2023-05-25 MED ORDER — DROPERIDOL 2.5 MG/ML IJ SOLN: 0.6250 mg | Freq: Once | INTRAMUSCULAR | Status: DC | PRN

## 2023-05-25 MED ORDER — CEFAZOLIN SODIUM-DEXTROSE 2-4 GM/100ML-% IV SOLN
2.0000 g | INTRAVENOUS | Status: AC
  Administered 2023-05-25: 2 g via INTRAVENOUS

## 2023-05-25 MED ORDER — PHENYLEPHRINE 80 MCG/ML (10ML) SYRINGE FOR IV PUSH (FOR BLOOD PRESSURE SUPPORT)
PREFILLED_SYRINGE | INTRAVENOUS | Status: AC
  Filled 2023-05-25: qty 10

## 2023-05-25 MED ORDER — OXYCODONE HCL 5 MG PO TABS: 5.0000 mg | ORAL_TABLET | Freq: Once | ORAL | Status: DC | PRN

## 2023-05-25 MED ORDER — ONDANSETRON HCL 4 MG/2ML IJ SOLN
INTRAMUSCULAR | Status: DC | PRN
  Administered 2023-05-25: 4 mg via INTRAVENOUS

## 2023-05-25 MED ORDER — DEXAMETHASONE SODIUM PHOSPHATE 10 MG/ML IJ SOLN
INTRAMUSCULAR | Status: DC | PRN
  Administered 2023-05-25: 8 mg via INTRAVENOUS

## 2023-05-25 MED ORDER — LIDOCAINE 2% (20 MG/ML) 5 ML SYRINGE
INTRAMUSCULAR | Status: DC | PRN
  Administered 2023-05-25: 100 mg via INTRAVENOUS

## 2023-05-25 MED ORDER — ONDANSETRON HCL 4 MG/2ML IJ SOLN
INTRAMUSCULAR | Status: AC
Start: 2023-05-25 — End: ?
  Filled 2023-05-25: qty 2

## 2023-05-25 MED ORDER — CEFAZOLIN SODIUM-DEXTROSE 2-4 GM/100ML-% IV SOLN
INTRAVENOUS | Status: AC
  Filled 2023-05-25: qty 100

## 2023-05-25 MED ORDER — OXYCODONE HCL 5 MG/5ML PO SOLN: 5.0000 mg | Freq: Once | ORAL | Status: DC | PRN

## 2023-05-25 MED ORDER — FENTANYL CITRATE (PF) 100 MCG/2ML IJ SOLN: 25.0000 ug | INTRAMUSCULAR | Status: DC | PRN

## 2023-05-25 MED ORDER — EPHEDRINE 5 MG/ML INJ
INTRAVENOUS | Status: AC
  Filled 2023-05-25: qty 5

## 2023-05-25 MED ORDER — PROPOFOL 10 MG/ML IV BOLUS
INTRAVENOUS | Status: DC | PRN
  Administered 2023-05-25: 130 mg via INTRAVENOUS

## 2023-05-25 MED ORDER — DEXAMETHASONE SODIUM PHOSPHATE 10 MG/ML IJ SOLN
INTRAMUSCULAR | Status: AC
  Filled 2023-05-25: qty 1

## 2023-05-25 MED ORDER — FENTANYL CITRATE (PF) 100 MCG/2ML IJ SOLN
INTRAMUSCULAR | Status: DC | PRN
Start: 2023-05-25 — End: 2023-05-25
  Administered 2023-05-25 (×2): 25 ug via INTRAVENOUS

## 2023-05-25 MED ORDER — ACETAMINOPHEN 500 MG PO TABS
ORAL_TABLET | ORAL | Status: AC
  Filled 2023-05-25: qty 2

## 2023-05-25 MED ORDER — BUPIVACAINE-EPINEPHRINE 0.25% -1:200000 IJ SOLN
INTRAMUSCULAR | Status: DC | PRN
  Administered 2023-05-25: 10 mL

## 2023-05-25 SURGICAL SUPPLY — 38 items
APPLIER CLIP 9.375 MED OPEN (MISCELLANEOUS) IMPLANT
BENZOIN TINCTURE PRP APPL 2/3 (GAUZE/BANDAGES/DRESSINGS) ×2 IMPLANT
BLADE HEX COATED 2.75 (ELECTRODE) ×2 IMPLANT
BLADE SURG 15 STRL LF DISP TIS (BLADE) ×2 IMPLANT
CANISTER SUCT 1200ML W/VALVE (MISCELLANEOUS) ×2 IMPLANT
CHLORAPREP W/TINT 26 (MISCELLANEOUS) ×2 IMPLANT
CLIP APPLIE 9.375 MED OPEN (MISCELLANEOUS) IMPLANT
COVER BACK TABLE 60X90IN (DRAPES) ×2 IMPLANT
COVER MAYO STAND STRL (DRAPES) ×2 IMPLANT
COVER PROBE CYLINDRICAL 5X96 (MISCELLANEOUS) ×2 IMPLANT
DRAPE LAPAROTOMY 100X72 PEDS (DRAPES) ×2 IMPLANT
DRAPE UTILITY XL STRL (DRAPES) ×2 IMPLANT
DRSG TEGADERM 4X4.75 (GAUZE/BANDAGES/DRESSINGS) ×2 IMPLANT
ELECT REM PT RETURN 9FT ADLT (ELECTROSURGICAL) ×1 IMPLANT
ELECTRODE REM PT RTRN 9FT ADLT (ELECTROSURGICAL) ×2 IMPLANT
GAUZE SPONGE 2X2 STRL 8-PLY (GAUZE/BANDAGES/DRESSINGS) IMPLANT
GAUZE SPONGE 4X4 12PLY STRL LF (GAUZE/BANDAGES/DRESSINGS) ×2 IMPLANT
GLOVE BIO SURGEON STRL SZ7 (GLOVE) ×2 IMPLANT
GLOVE BIOGEL PI IND STRL 7.5 (GLOVE) ×2 IMPLANT
GOWN STRL REUS W/ TWL LRG LVL3 (GOWN DISPOSABLE) ×4 IMPLANT
KIT MARKER MARGIN INK (KITS) ×2 IMPLANT
NDL HYPO 25X1 1.5 SAFETY (NEEDLE) ×2 IMPLANT
NEEDLE HYPO 25X1 1.5 SAFETY (NEEDLE) ×1 IMPLANT
NS IRRIG 1000ML POUR BTL (IV SOLUTION) ×2 IMPLANT
PACK BASIN DAY SURGERY FS (CUSTOM PROCEDURE TRAY) ×2 IMPLANT
PENCIL SMOKE EVACUATOR (MISCELLANEOUS) ×2 IMPLANT
SLEEVE SCD COMPRESS KNEE MED (STOCKING) ×2 IMPLANT
SPIKE FLUID TRANSFER (MISCELLANEOUS) IMPLANT
SPONGE T-LAP 4X18 ~~LOC~~+RFID (SPONGE) ×2 IMPLANT
STRIP CLOSURE SKIN 1/2X4 (GAUZE/BANDAGES/DRESSINGS) ×2 IMPLANT
SUT MON AB 4-0 PC3 18 (SUTURE) ×2 IMPLANT
SUT SILK 2 0 SH (SUTURE) IMPLANT
SUT VIC AB 3-0 SH 27X BRD (SUTURE) ×2 IMPLANT
SYR CONTROL 10ML LL (SYRINGE) ×2 IMPLANT
TOWEL GREEN STERILE FF (TOWEL DISPOSABLE) ×2 IMPLANT
TRAY FAXITRON CT DISP (TRAY / TRAY PROCEDURE) ×2 IMPLANT
TUBE CONNECTING 20X1/4 (TUBING) ×2 IMPLANT
YANKAUER SUCT BULB TIP NO VENT (SUCTIONS) ×2 IMPLANT

## 2023-05-25 NOTE — Discharge Instructions (Addendum)
[facility] Office Phone Number [phone]  BREAST BIOPSY/ PARTIAL MASTECTOMY: POST OP INSTRUCTIONS  Always review your discharge instruction sheet given to you by the facility where your surgery was performed.  IF YOU HAVE DISABILITY OR FAMILY LEAVE FORMS, YOU MUST BRING THEM TO THE OFFICE FOR PROCESSING.  DO NOT GIVE THEM TO YOUR DOCTOR.  A prescription for pain medication may be given to you upon discharge.  Take your pain medication as prescribed, if needed.  If narcotic pain medicine is not needed, then you may take acetaminophen (Tylenol) or ibuprofen (Advil) as needed. Take your usually prescribed medications unless otherwise directed If you need a refill on your pain medication, please contact your pharmacy.  They will contact our office to request authorization.  Prescriptions will not be filled after 5pm or on week-ends. You should eat very light the first 24 hours after surgery, such as soup, crackers, pudding, etc.  Resume your normal diet the day after surgery. Most patients will experience some swelling and bruising in the breast.  Ice packs and a good support bra will help.  Swelling and bruising can take several days to resolve.  It is common to experience some constipation if taking pain medication after surgery.  Increasing fluid intake and taking a stool softener will usually help or prevent this problem from occurring.  A mild laxative (Milk of Magnesia or Miralax) should be taken according to package directions if there are no bowel movements after 48 hours. Unless discharge instructions indicate otherwise, you may remove your bandages 24-48 hours after surgery, and you may shower at that time.  You may have steri-strips (small skin tapes) in place directly over the incision.  These strips should be left on the skin for 7-10 days.  If your surgeon used skin glue on the incision, you may shower in 24 hours.  The glue will flake off over the next 2-3 weeks.  Any  sutures or staples will be removed at the office during your follow-up visit. ACTIVITIES:  You may resume regular daily activities (gradually increasing) beginning the next day.  Wearing a good support bra or sports bra minimizes pain and swelling.  You may have sexual intercourse when it is comfortable. You may drive when you no longer are taking prescription pain medication, you can comfortably wear a seatbelt, and you can safely maneuver your car and apply brakes. RETURN TO WORK:  ______________________________________________________________________________________ You should see your doctor in the office for a follow-up appointment approximately two weeks after your surgery.  Your doctor's nurse will typically make your follow-up appointment when she calls you with your pathology report.  Expect your pathology report 2-3 business days after your surgery.  You may call to check if you do not hear from us after three days. OTHER INSTRUCTIONS: _______________________________________________________________________________________________ _____________________________________________________________________________________________________________________________________ _____________________________________________________________________________________________________________________________________ _____________________________________________________________________________________________________________________________________  WHEN TO CALL YOUR DOCTOR: Fever over 101.0 Nausea and/or vomiting. Extreme swelling or bruising. Continued bleeding from incision. Increased pain, redness, or drainage from the incision.  The clinic staff is available to answer your questions during regular business hours.  Please don't hesitate to call and ask to speak to one of the nurses for clinical concerns.  If you have a medical emergency, go to the nearest emergency room or call 911.  A surgeon from [facility] is always on call at the hospital.  For further questions, please visit [email or web address]    Post Anesthesia Home Care Instructions  Activity: Get plenty of rest for the remainder of   the day. A responsible individual must stay with you for 24 hours following the procedure.  For the next 24 hours, DO NOT: -Drive a car -Operate machinery -Drink alcoholic beverages -Take any medication unless instructed by your physician -Make any legal decisions or sign important papers.  Meals: Start with liquid foods such as gelatin or soup. Progress to regular foods as tolerated. Avoid greasy, spicy, heavy foods. If nausea and/or vomiting occur, drink only clear liquids until the nausea and/or vomiting subsides. Call your physician if vomiting continues.  Special Instructions/Symptoms: Your throat may feel dry or sore from the anesthesia or the breathing tube placed in your throat during surgery. If this causes discomfort, gargle with warm salt water. The discomfort should disappear within 24 hours.  If you had a scopolamine patch placed behind your ear for the management of post- operative nausea and/or vomiting:  1. The medication in the patch is effective for 72 hours, after which it should be removed.  Wrap patch in a tissue and discard in the trash. Wash hands thoroughly with soap and water. 2. You may remove the patch earlier than 72 hours if you experience unpleasant side effects which may include dry mouth, dizziness or visual disturbances. 3. Avoid touching the patch. Wash your hands with soap and water after contact with the patch.      

## 2023-05-25 NOTE — Transfer of Care (Signed)
 Immediate Anesthesia Transfer of Care Note  Patient: [name] [name]  Procedure(s) Performed: LEFT BREAST RADIOACTIVE SEED LOCALIZED EXCISIONAL BIOPSY (Left: Breast)  Patient Location: PACU  Anesthesia Type:General  Level of Consciousness: awake, oriented, and patient cooperative  Airway & Oxygen Therapy: Patient Spontanous Breathing and Patient connected to face mask oxygen  Post-op Assessment: Report given to RN and Post -op Vital signs reviewed and stable  Post vital signs: Reviewed and stable  Last Vitals:  Vitals Value Taken Time  BP 157/71 05/25/23 1103  Temp 36.1 C 05/25/23 1103  Pulse 97 05/25/23 1105  Resp 15 05/25/23 1105  SpO2 98 % 05/25/23 1105  Vitals shown include unfiled device data.  Last Pain:  Vitals:   05/25/23 0831  TempSrc: Temporal  PainSc: 0-No pain         Complications: No notable events documented.

## 2023-05-25 NOTE — Anesthesia Postprocedure Evaluation (Signed)
 Anesthesia Post Note  Patient: [name] [name]  Procedure(s) Performed: LEFT BREAST RADIOACTIVE SEED LOCALIZED EXCISIONAL BIOPSY (Left: Breast)     Patient location during evaluation: PACU Anesthesia Type: General Level of consciousness: awake and alert Pain management: pain level controlled Vital Signs Assessment: post-procedure vital signs reviewed and stable Respiratory status: spontaneous breathing, nonlabored ventilation and respiratory function stable Cardiovascular status: blood pressure returned to baseline Postop Assessment: no apparent nausea or vomiting Anesthetic complications: no   No notable events documented.  Last Vitals:  Vitals:   05/25/23 1120 05/25/23 1130  BP:  (!) 142/71  Pulse: 84 82  Resp: 16 18  Temp:  (!) 36.2 C  SpO2: 97% 95%    Last Pain:  Vitals:   05/25/23 1130  TempSrc: Temporal  PainSc: 0-No pain                 Shanda Howells

## 2023-05-25 NOTE — Anesthesia Procedure Notes (Signed)
 Procedure Name: LMA Insertion Date/Time: 05/25/2023 10:28 AM  Performed by: Yolanda Bonine, CRNAPre-anesthesia Checklist: Patient identified, Emergency Drugs available, Suction available, Patient being monitored and Timeout performed Patient Re-evaluated:Patient Re-evaluated prior to induction Oxygen Delivery Method: Circle system utilized Preoxygenation: Pre-oxygenation with 100% oxygen Induction Type: IV induction Ventilation: Mask ventilation without difficulty LMA: LMA inserted LMA Size: 4.0 Placement Confirmation: positive ETCO2 and breath sounds checked- equal and bilateral Tube secured with: Tape Dental Injury: Teeth and Oropharynx as per pre-operative assessment

## 2023-05-25 NOTE — Op Note (Signed)
 Pre-op Diagnosis:  left intraductal papilloma Post-op Diagnosis: same Procedure:  Left breast radioactive seed localized excisional biopsy Surgeon:  Daritza Brees K. Anesthesia:  GEN - LMA Indications:  This is a 77 year old female who presented in October 2022 after a routine screening mammogram that revealed an area of distortion in the left breast US showed a vague mass at 10:00 4 cmfn.  This was biopsied and revealed only usual ductal hyperplasia, which was felt to be discordant.  She developed a large hematoma in the biopsy site, which has displaced the biopsy clip from the area of distortion.  The biopsy was performed on 12/05/20.   In 2019, the patient had a right radioactive seed localized lumpectomy for a separate discordant biopsy.  Lumpectomy at that time revealed a radial scar but no sign of malignancy.   We recommended 49-month follow-up imaging after resolution of the hematoma.  Unfortunately, the patient did not have any further imaging until her annual mammogram on 01/07/2022.  She continues to have an area of distortion in the left breast in the retroareolar region at 6:00.  She presents now to discuss excision.  The patient has noticed some pain radiating from her sternum towards her left breast.  This is mostly in the lower part of her breast.  This has been present for several months.   On 02/11/2022, she underwent left breast radioactive seed localized lumpectomy.  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  No sign of malignancy.  The patient has sensitivity at her incision for many months after her surgery.   Recently the patient developed spontaneous bruising and a small palpable mass in the left upper outer quadrant.  The spontaneous bruising resolved within a week.  Subsequently she underwent routine bilateral diagnostic mammograms for her annual surveillance.  She had an area of concern in the left upper outer breast that revealed a  0.9 cm mass in the left breast at 2:00 located 5 cm from the nipple.  This corresponds to the area of palpable mass.  She underwent biopsy of this area that revealed intraductal papilloma.  The patient had significant bruising and a palpable hematoma in this area.  This has resolved and a seed was placed yesterday by Radiology.  Description of procedure: The patient is brought to the operating room placed in supine position on the operating room table. After an adequate level of general anesthesia was obtained, her left breast was prepped with ChloraPrep and draped in sterile fashion. A timeout was taken to ensure the proper patient and proper procedure. We interrogated the breast with the neoprobe. We made a transverse incision in the lateral left breast after infiltrating with 0.25% Marcaine. Dissection was carried down in the breast tissue with cautery. We used the neoprobe to guide [place] towards the radioactive seed. We excised an area of tissue around the radioactive seed 1.5 cm in diameter. The specimen was removed and was oriented with a paint kit. Specimen mammogram showed the radioactive seed as well as the biopsy clip within the specimen. This was sent for pathologic examination. There is no residual radioactivity within the biopsy cavity. We inspected carefully for hemostasis. The wound was thoroughly irrigated. The wound was closed with a deep layer of 3-0 Vicryl and a subcuticular layer of 4-0 Monocryl. Benzoin Steri-Strips were applied. The patient was then extubated and brought to the recovery room in stable condition. All sponge, instrument, and needle counts are correct.  [name] [name]. [name] Gilson, MD, FACS  [facility]  General/ Trauma Surgery  05/25/2023 10:59 AM

## 2023-05-25 NOTE — H&P (Addendum)
 Subjective    Chief Complaint: New Problem ( Lt sclerotic intraductal papilloma)         History of Present Illness: [name] [name] is a 78 y.o. female who is seen today as an office consultation at the request of Dr. Jesse Sans for evaluation of New Problem ( Lt sclerotic intraductal papilloma) .   This is a 78 year old female who presented in October 2022 after a routine screening mammogram that revealed an area of distortion in the left breast US showed a vague mass at 10:00 4 cmfn.  This was biopsied and revealed only usual ductal hyperplasia, which was felt to be discordant.  She developed a large hematoma in the biopsy site, which has displaced the biopsy clip from the area of distortion.  The biopsy was performed on 12/05/20.   In 2019, the patient had a right radioactive seed localized lumpectomy for a separate discordant biopsy.  Lumpectomy at that time revealed a radial scar but no sign of malignancy.   We recommended 41-month follow-up imaging after resolution of the hematoma.  Unfortunately, the patient did not have any further imaging until her annual mammogram on 01/07/2022.  She continues to have an area of distortion in the left breast in the retroareolar region at 6:00.  She presents now to discuss excision.  The patient has noticed some pain radiating from her sternum towards her left breast.  This is mostly in the lower part of her breast.  This has been present for several months.   On 02/11/2022, she underwent left breast radioactive seed localized lumpectomy.  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  No sign of malignancy.  The patient has sensitivity at her incision for many months after her surgery.   Recently the patient developed spontaneous bruising and a small palpable mass in the left upper outer quadrant.  The spontaneous bruising resolved within a week.  Subsequently she underwent routine bilateral diagnostic  mammograms for her annual surveillance.  She had an area of concern in the left upper outer breast that revealed a 0.9 cm mass in the left breast at 2:00 located 5 cm from the nipple.  This corresponds to the area of palpable mass.  She underwent biopsy of this area that revealed intraductal papilloma.  The patient had significant bruising and a palpable hematoma in this area.     Medical History: Past Medical History         Past Medical History:  Diagnosis Date   Hypertension     Sleep apnea          Problem List       Patient Active Problem List  Diagnosis   Anxiety state, unspecified   CAD (coronary artery disease)   Depression   Esophageal reflux   Hyperlipidemia   Melena   Peptic ulcer   Rectal bleeding   Syncope   Hypertension   Vertigo   Subareolar mass of left breast        Past Surgical History           Past Surgical History:  Procedure Laterality Date   ABDOMINAL AORTIC ANEURYSM REPAIR       MASTECTOMY PARTIAL / LUMPECTOMY        2018        Allergies           Allergies  Allergen Reactions   Aspirin Hives   Codeine Unknown      REACTION: Hallucinations  Penicillins Unknown      REACTION: Rash        Medications Ordered Prior to Encounter             Current Outpatient Medications on File Prior to Visit  Medication Sig Dispense Refill   amLODIPine (NORVASC) 2.5 MG tablet Take 2.5 mg by mouth once daily       aspirin 81 MG EC tablet Take 81 mg by mouth once daily       atorvastatin (LIPITOR) 20 MG tablet Take 20 mg by mouth once daily       zolpidem (AMBIEN) 10 mg tablet Take 10 mg by mouth at bedtime        No current facility-administered medications on file prior to visit.        Family History  Family History  Family history unknown: Yes        Tobacco Use History  Social History         Tobacco Use  Smoking Status Never  Smokeless Tobacco Never        Social History  Social History           Socioeconomic History    Marital status: Married  Tobacco Use   Smoking status: Never   Smokeless tobacco: Never  Vaping Use   Vaping status: Never Used  Substance and Sexual Activity   Alcohol use: Never   Drug use: Never   Sexual activity: Defer    Social Drivers of Health           Housing Stability: Unknown (04/23/2023)    Housing Stability Vital Sign     Homeless in the Last Year: No        Objective:           Vitals:    04/23/23 0951  BP: (!) 142/80  Pulse: 80  Temp: 36.4 C (97.5 F)  SpO2: 98%  Weight: 78.3 kg (172 lb 9.6 oz)  Height: 152.4 cm (5')  PainSc:   7    Body mass index is 33.71 kg/m.   Physical Exam    Constitutional:  WDWN in NAD, conversant, no obvious deformities; lying in bed comfortably Eyes:  Pupils equal, round; sclera anicteric; moist conjunctiva; no lid lag HENT:  Oral mucosa moist; good dentition  Neck:  No masses palpated, trachea midline; no thyromegaly Lungs:  CTA bilaterally; normal respiratory effort Breasts:  symmetric, no nipple changes; no palpable masses or lymphadenopathy on the right side.  Left breast shows significant ecchymosis and a 3 cm hematoma in the left upper outer quadrant.  No other palpable masses.  No nipple discharge. CV:  Regular rate and rhythm; no murmurs; extremities well-perfused with no edema Abd:  +bowel sounds, soft, non-tender, no palpable organomegaly; no palpable hernias Musc: Normal gait; no apparent clubbing or cyanosis in extremities Lymphatic:  No palpable cervical or axillary lymphadenopathy Skin:  Warm, dry; no sign of jaundice Psychiatric - alert and oriented x 4; calm mood and affect     Labs, Imaging and Diagnostic Testing: CLINICAL DATA:  78 year old female with history of left breast bruising and subsequent palpable area of concern. The bruising has since resolved. History of benign left breast excision 02/11/2022.   EXAM: DIGITAL DIAGNOSTIC BILATERAL MAMMOGRAM WITH TOMOSYNTHESIS AND CAD; ULTRASOUND LEFT  BREAST; ULTRASOUND RIGHT BREAST   TECHNIQUE: Bilateral digital diagnostic mammography and breast tomosynthesis was performed. The images were evaluated with computer-aided detection. Targeted ultrasound of the bilateral breasts was performed.   COMPARISON:  Previous exam(s).   ACR Breast Density Category b: There are scattered areas of fibroglandular density.   FINDINGS: Postsurgical changes are present in the central to slightly upper left breast related to interval benign excision. Spot compression tomograms were performed at the site of palpable concern in the upper-outer left breast demonstrating a mass with margin irregularity measuring 1.2 cm. In addition located slightly more superficially there are several adjacent fat containing masses compatible with oil cyst/fat necrosis. There is an oval circumscribed mass in the slightly upper inner right breast measuring 0.6 cm.   Physical examination at site of palpable concern in the left breast reveals a firm mass at the approximate 2 o'clock position.   Targeted ultrasound of the right breast was performed. There is a simple cyst in the right breast at 1 o'clock 4 cm from nipple measuring 0.7 x 0.3 x 0.8 cm. This corresponds well with the mass seen in the slightly upper inner right breast at mammography. No suspicious masses or any other worrisome abnormality seen in the upper inner right breast sonographically.   Targeted ultrasound of the left breast was performed. There is an irregular hypoechoic mass in the left breast at 2 o'clock 5 cm from nipple measuring 0.9 x 0.8 x 0.6 cm. This corresponds with the area of palpable concern and mammography findings. In addition, there is a hyperechoic area containing cystic spaces at 2 o'clock 2 cm from nipple measuring 2.3 x 0.6 x 1.5 cm. This corresponds with the fat necrosis seen in the left breast at mammography.   Normal lymph nodes are present in the left axilla.    IMPRESSION: Suspicious palpable 0.9 cm mass in the left breast at 2 o'clock 5 cm from nipple.   RECOMMENDATION: Recommend ultrasound-guided core biopsy of the palpable mass in the left breast at 2 o'clock 5 cm from nipple.   I have discussed the findings and recommendations with the patient. If applicable, a reminder letter will be sent to the patient regarding the next appointment.   BI-RADS CATEGORY  4: Suspicious.     Electronically Signed   By: Edwin Cap M.D.   On: 04/13/2023 11:12   Assessment and Plan:  Diagnoses and all orders for this visit:   Intraductal papilloma of breast, left     Recommend left breast radioactive seed localized lumpectomy.  Recommend waiting about a month to allow the hematoma to resolve.   The surgical procedure has been discussed with the patient.  Potential risks, benefits, alternative treatments, and expected outcomes have been explained.  All of the patient's questions at this time have been answered.  The likelihood of reaching the patient's treatment goal is good.  The patient understands the proposed surgical procedure and wishes to proceed.

## 2023-05-26 ENCOUNTER — Encounter (HOSPITAL_BASED_OUTPATIENT_CLINIC_OR_DEPARTMENT_OTHER): Payer: Self-pay | Admitting: Surgery

## 2023-05-27 LAB — SURGICAL PATHOLOGY
